# Patient Record
Sex: Male | Born: 1940 | Race: White | Hispanic: No | Marital: Married | Smoking: Never smoker
Health system: Southern US, Community
[De-identification: ages and names within clinical notes are randomized; demographics above are authoritative.]

## PROBLEM LIST (undated history)

## (undated) DIAGNOSIS — E119 Type 2 diabetes mellitus without complications: Secondary | ICD-10-CM

## (undated) DIAGNOSIS — I2510 Atherosclerotic heart disease of native coronary artery without angina pectoris: Secondary | ICD-10-CM

## (undated) DIAGNOSIS — I10 Essential (primary) hypertension: Secondary | ICD-10-CM

## (undated) DIAGNOSIS — E785 Hyperlipidemia, unspecified: Secondary | ICD-10-CM

## (undated) DIAGNOSIS — Z951 Presence of aortocoronary bypass graft: Secondary | ICD-10-CM

## (undated) DIAGNOSIS — I350 Nonrheumatic aortic (valve) stenosis: Secondary | ICD-10-CM

## (undated) DIAGNOSIS — I4891 Unspecified atrial fibrillation: Secondary | ICD-10-CM

## (undated) HISTORY — DX: Atherosclerotic heart disease of native coronary artery without angina pectoris: I25.10

## (undated) HISTORY — DX: Type 2 diabetes mellitus without complications: E11.9

## (undated) HISTORY — PX: CORONARY ARTERY BYPASS GRAFT: SHX141

## (undated) HISTORY — DX: Hyperlipidemia, unspecified: E78.5

## (undated) HISTORY — DX: Presence of aortocoronary bypass graft: Z95.1

## (undated) HISTORY — DX: Nonrheumatic aortic (valve) stenosis: I35.0

## (undated) HISTORY — DX: Unspecified atrial fibrillation: I48.91

## (undated) HISTORY — DX: Essential (primary) hypertension: I10

---

## 2001-08-25 DIAGNOSIS — I2510 Atherosclerotic heart disease of native coronary artery without angina pectoris: Secondary | ICD-10-CM | POA: Diagnosis present

## 2020-08-25 ENCOUNTER — Other Ambulatory Visit: Payer: Self-pay

## 2020-08-25 ENCOUNTER — Emergency Department (HOSPITAL_COMMUNITY): Payer: No Typology Code available for payment source

## 2020-08-25 ENCOUNTER — Encounter (HOSPITAL_COMMUNITY): Payer: Self-pay | Admitting: Family Medicine

## 2020-08-25 ENCOUNTER — Inpatient Hospital Stay (HOSPITAL_COMMUNITY)
Admission: EM | Admit: 2020-08-25 | Discharge: 2020-08-27 | DRG: 100 | Disposition: A | Discharge status: Home Health | Payer: No Typology Code available for payment source | Attending: Internal Medicine | Admitting: Internal Medicine

## 2020-08-25 DIAGNOSIS — Z794 Long term (current) use of insulin: Secondary | ICD-10-CM | POA: Diagnosis not present

## 2020-08-25 DIAGNOSIS — N400 Enlarged prostate without lower urinary tract symptoms: Secondary | ICD-10-CM | POA: Diagnosis present

## 2020-08-25 DIAGNOSIS — E1122 Type 2 diabetes mellitus with diabetic chronic kidney disease: Secondary | ICD-10-CM | POA: Diagnosis present

## 2020-08-25 DIAGNOSIS — F0390 Unspecified dementia without behavioral disturbance: Secondary | ICD-10-CM | POA: Diagnosis present

## 2020-08-25 DIAGNOSIS — X58XXXA Exposure to other specified factors, initial encounter: Secondary | ICD-10-CM | POA: Diagnosis present

## 2020-08-25 DIAGNOSIS — N1832 Chronic kidney disease, stage 3b: Secondary | ICD-10-CM | POA: Diagnosis present

## 2020-08-25 DIAGNOSIS — E860 Dehydration: Secondary | ICD-10-CM

## 2020-08-25 DIAGNOSIS — S00512A Abrasion of oral cavity, initial encounter: Secondary | ICD-10-CM | POA: Diagnosis present

## 2020-08-25 DIAGNOSIS — Z951 Presence of aortocoronary bypass graft: Secondary | ICD-10-CM | POA: Diagnosis not present

## 2020-08-25 DIAGNOSIS — E538 Deficiency of other specified B group vitamins: Secondary | ICD-10-CM | POA: Diagnosis present

## 2020-08-25 DIAGNOSIS — E785 Hyperlipidemia, unspecified: Secondary | ICD-10-CM | POA: Diagnosis present

## 2020-08-25 DIAGNOSIS — G9341 Metabolic encephalopathy: Secondary | ICD-10-CM | POA: Diagnosis present

## 2020-08-25 DIAGNOSIS — R55 Syncope and collapse: Secondary | ICD-10-CM | POA: Diagnosis not present

## 2020-08-25 DIAGNOSIS — Z7982 Long term (current) use of aspirin: Secondary | ICD-10-CM | POA: Diagnosis not present

## 2020-08-25 DIAGNOSIS — Z7984 Long term (current) use of oral hypoglycemic drugs: Secondary | ICD-10-CM

## 2020-08-25 DIAGNOSIS — I2510 Atherosclerotic heart disease of native coronary artery without angina pectoris: Secondary | ICD-10-CM | POA: Diagnosis present

## 2020-08-25 DIAGNOSIS — E1169 Type 2 diabetes mellitus with other specified complication: Secondary | ICD-10-CM | POA: Diagnosis present

## 2020-08-25 DIAGNOSIS — N179 Acute kidney failure, unspecified: Secondary | ICD-10-CM | POA: Diagnosis present

## 2020-08-25 DIAGNOSIS — I350 Nonrheumatic aortic (valve) stenosis: Secondary | ICD-10-CM | POA: Diagnosis present

## 2020-08-25 DIAGNOSIS — I4891 Unspecified atrial fibrillation: Secondary | ICD-10-CM | POA: Diagnosis present

## 2020-08-25 DIAGNOSIS — E119 Type 2 diabetes mellitus without complications: Secondary | ICD-10-CM

## 2020-08-25 DIAGNOSIS — Z20822 Contact with and (suspected) exposure to covid-19: Secondary | ICD-10-CM | POA: Diagnosis present

## 2020-08-25 DIAGNOSIS — R569 Unspecified convulsions: Principal | ICD-10-CM

## 2020-08-25 DIAGNOSIS — D320 Benign neoplasm of cerebral meninges: Secondary | ICD-10-CM | POA: Diagnosis present

## 2020-08-25 DIAGNOSIS — Z79899 Other long term (current) drug therapy: Secondary | ICD-10-CM | POA: Diagnosis not present

## 2020-08-25 DIAGNOSIS — R7989 Other specified abnormal findings of blood chemistry: Secondary | ICD-10-CM | POA: Diagnosis present

## 2020-08-25 DIAGNOSIS — G40209 Localization-related (focal) (partial) symptomatic epilepsy and epileptic syndromes with complex partial seizures, not intractable, without status epilepticus: Secondary | ICD-10-CM

## 2020-08-25 DIAGNOSIS — R778 Other specified abnormalities of plasma proteins: Secondary | ICD-10-CM | POA: Diagnosis present

## 2020-08-25 DIAGNOSIS — I447 Left bundle-branch block, unspecified: Secondary | ICD-10-CM

## 2020-08-25 DIAGNOSIS — I129 Hypertensive chronic kidney disease with stage 1 through stage 4 chronic kidney disease, or unspecified chronic kidney disease: Secondary | ICD-10-CM | POA: Diagnosis present

## 2020-08-25 DIAGNOSIS — I351 Nonrheumatic aortic (valve) insufficiency: Secondary | ICD-10-CM | POA: Diagnosis not present

## 2020-08-25 DIAGNOSIS — I10 Essential (primary) hypertension: Secondary | ICD-10-CM | POA: Diagnosis present

## 2020-08-25 DIAGNOSIS — D72829 Elevated white blood cell count, unspecified: Secondary | ICD-10-CM | POA: Diagnosis present

## 2020-08-25 DIAGNOSIS — I25810 Atherosclerosis of coronary artery bypass graft(s) without angina pectoris: Secondary | ICD-10-CM | POA: Diagnosis not present

## 2020-08-25 DIAGNOSIS — I480 Paroxysmal atrial fibrillation: Secondary | ICD-10-CM | POA: Diagnosis present

## 2020-08-25 DIAGNOSIS — I340 Nonrheumatic mitral (valve) insufficiency: Secondary | ICD-10-CM | POA: Diagnosis not present

## 2020-08-25 HISTORY — DX: Left bundle-branch block, unspecified: I44.7

## 2020-08-25 LAB — CBC WITH DIFFERENTIAL/PLATELET
Abs Immature Granulocytes: 0.11 10*3/uL — ABNORMAL HIGH (ref 0.00–0.07)
Basophils Absolute: 0 10*3/uL (ref 0.0–0.1)
Basophils Relative: 0 %
Eosinophils Absolute: 0.1 10*3/uL (ref 0.0–0.5)
Eosinophils Relative: 0 %
HCT: 40.6 % (ref 39.0–52.0)
Hemoglobin: 13.1 g/dL (ref 13.0–17.0)
Immature Granulocytes: 1 %
Lymphocytes Relative: 4 %
Lymphs Abs: 0.7 10*3/uL (ref 0.7–4.0)
MCH: 29.7 pg (ref 26.0–34.0)
MCHC: 32.3 g/dL (ref 30.0–36.0)
MCV: 92.1 fL (ref 80.0–100.0)
Monocytes Absolute: 0.7 10*3/uL (ref 0.1–1.0)
Monocytes Relative: 4 %
Neutro Abs: 15.4 10*3/uL — ABNORMAL HIGH (ref 1.7–7.7)
Neutrophils Relative %: 91 %
Platelets: 275 10*3/uL (ref 150–400)
RBC: 4.41 MIL/uL (ref 4.22–5.81)
RDW: 13.5 % (ref 11.5–15.5)
WBC: 16.9 10*3/uL — ABNORMAL HIGH (ref 4.0–10.5)
nRBC: 0 % (ref 0.0–0.2)

## 2020-08-25 LAB — RESP PANEL BY RT-PCR (FLU A&B, COVID) ARPGX2
Influenza A by PCR: NEGATIVE
Influenza B by PCR: NEGATIVE
SARS Coronavirus 2 by RT PCR: NEGATIVE

## 2020-08-25 LAB — I-STAT VENOUS BLOOD GAS, ED
Acid-base deficit: 3 mmol/L — ABNORMAL HIGH (ref 0.0–2.0)
Bicarbonate: 20.4 mmol/L (ref 20.0–28.0)
Calcium, Ion: 1.05 mmol/L — ABNORMAL LOW (ref 1.15–1.40)
HCT: 40 % (ref 39.0–52.0)
Hemoglobin: 13.6 g/dL (ref 13.0–17.0)
O2 Saturation: 99 %
Potassium: 3.6 mmol/L (ref 3.5–5.1)
Sodium: 137 mmol/L (ref 135–145)
TCO2: 21 mmol/L — ABNORMAL LOW (ref 22–32)
pCO2, Ven: 31.9 mmHg — ABNORMAL LOW (ref 44.0–60.0)
pH, Ven: 7.413 (ref 7.250–7.430)
pO2, Ven: 127 mmHg — ABNORMAL HIGH (ref 32.0–45.0)

## 2020-08-25 LAB — I-STAT CHEM 8, ED
BUN: 20 mg/dL (ref 8–23)
Calcium, Ion: 1.02 mmol/L — ABNORMAL LOW (ref 1.15–1.40)
Chloride: 105 mmol/L (ref 98–111)
Creatinine, Ser: 1.6 mg/dL — ABNORMAL HIGH (ref 0.61–1.24)
Glucose, Bld: 339 mg/dL — ABNORMAL HIGH (ref 70–99)
HCT: 42 % (ref 39.0–52.0)
Hemoglobin: 14.3 g/dL (ref 13.0–17.0)
Potassium: 3.6 mmol/L (ref 3.5–5.1)
Sodium: 136 mmol/L (ref 135–145)
TCO2: 20 mmol/L — ABNORMAL LOW (ref 22–32)

## 2020-08-25 LAB — URINALYSIS, ROUTINE W REFLEX MICROSCOPIC
Bacteria, UA: NONE SEEN
Bilirubin Urine: NEGATIVE
Glucose, UA: >=500 mg/dL — AB
Ketones, ur: 20 mg/dL — AB
Leukocytes,Ua: NEGATIVE
Nitrite: NEGATIVE
Protein, ur: 100 mg/dL — AB
Specific Gravity, Urine: 1.018 (ref 1.005–1.030)
pH: 5 (ref 5.0–8.0)

## 2020-08-25 LAB — TSH: TSH: 1.68 u[IU]/mL (ref 0.350–4.500)

## 2020-08-25 LAB — COMPREHENSIVE METABOLIC PANEL
ALT: 20 U/L (ref 0–44)
AST: 30 U/L (ref 15–41)
Albumin: 3.9 g/dL (ref 3.5–5.0)
Alkaline Phosphatase: 70 U/L (ref 38–126)
Anion gap: 13 (ref 5–15)
BUN: 19 mg/dL (ref 8–23)
CO2: 20 mmol/L — ABNORMAL LOW (ref 22–32)
Calcium: 9.4 mg/dL (ref 8.9–10.3)
Chloride: 102 mmol/L (ref 98–111)
Creatinine, Ser: 1.7 mg/dL — ABNORMAL HIGH (ref 0.61–1.24)
GFR, Estimated: 40 mL/min — ABNORMAL LOW (ref >60)
Glucose, Bld: 334 mg/dL — ABNORMAL HIGH (ref 70–99)
Potassium: 3.5 mmol/L (ref 3.5–5.1)
Sodium: 135 mmol/L (ref 135–145)
Total Bilirubin: 1 mg/dL (ref 0.3–1.2)
Total Protein: 7.3 g/dL (ref 6.5–8.1)

## 2020-08-25 LAB — AMMONIA: Ammonia: <9 umol/L — ABNORMAL LOW (ref 9–35)

## 2020-08-25 LAB — RAPID URINE DRUG SCREEN, HOSP PERFORMED
Amphetamines: NOT DETECTED
Barbiturates: NOT DETECTED
Benzodiazepines: POSITIVE — AB
Cocaine: NOT DETECTED
Opiates: NOT DETECTED
Tetrahydrocannabinol: NOT DETECTED

## 2020-08-25 LAB — VITAMIN B12: Vitamin B-12: 79 pg/mL — ABNORMAL LOW (ref 180–914)

## 2020-08-25 LAB — TROPONIN I (HIGH SENSITIVITY)
Troponin I (High Sensitivity): 48 ng/L — ABNORMAL HIGH (ref <18)
Troponin I (High Sensitivity): 140 ng/L (ref <18)

## 2020-08-25 LAB — MAGNESIUM: Magnesium: 1.6 mg/dL — ABNORMAL LOW (ref 1.7–2.4)

## 2020-08-25 LAB — CBG MONITORING, ED: Glucose-Capillary: 210 mg/dL — ABNORMAL HIGH (ref 70–99)

## 2020-08-25 LAB — LACTIC ACID, PLASMA
Lactic Acid, Venous: 3.6 mmol/L (ref 0.5–1.9)
Lactic Acid, Venous: 1.9 mmol/L (ref 0.5–1.9)

## 2020-08-25 LAB — CK: Total CK: 187 U/L (ref 49–397)

## 2020-08-25 LAB — BRAIN NATRIURETIC PEPTIDE: B Natriuretic Peptide: 471.3 pg/mL — ABNORMAL HIGH (ref 0.0–100.0)

## 2020-08-25 MED ORDER — METOPROLOL TARTRATE 25 MG PO TABS
25.0000 mg | ORAL_TABLET | Freq: Two times a day (BID) | ORAL | Status: DC
  Filled 2020-08-25: qty 1

## 2020-08-25 MED ORDER — MAGNESIUM SULFATE 2 GM/50ML IV SOLN
2.0000 g | Freq: Once | INTRAVENOUS | Status: AC
  Administered 2020-08-25: 2 g via INTRAVENOUS
  Filled 2020-08-25: qty 50

## 2020-08-25 MED ORDER — LACTATED RINGERS IV SOLN: INTRAVENOUS | Status: AC

## 2020-08-25 MED ORDER — SODIUM CHLORIDE 0.9 % IV BOLUS
1000.0000 mL | Freq: Once | INTRAVENOUS | Status: AC
  Administered 2020-08-25: 1000 mL via INTRAVENOUS

## 2020-08-25 MED ORDER — INSULIN ASPART 100 UNIT/ML IJ SOLN
0.0000 [IU] | Freq: Three times a day (TID) | INTRAMUSCULAR | Status: DC
  Administered 2020-08-26: 1 [IU] via SUBCUTANEOUS
  Administered 2020-08-26 – 2020-08-27 (×3): 2 [IU] via SUBCUTANEOUS

## 2020-08-25 MED ORDER — LORAZEPAM 2 MG/ML IJ SOLN
1.0000 mg | Freq: Once | INTRAMUSCULAR | Status: AC
  Administered 2020-08-25: 1 mg via INTRAVENOUS
  Filled 2020-08-25: qty 1

## 2020-08-25 MED ORDER — MIDAZOLAM HCL 2 MG/2ML IJ SOLN
INTRAMUSCULAR | Status: AC
  Administered 2020-08-25: 2 mg
  Filled 2020-08-25: qty 2

## 2020-08-25 MED ORDER — LEVETIRACETAM IN NACL 1000 MG/100ML IV SOLN
1000.0000 mg | Freq: Once | INTRAVENOUS | Status: AC
  Administered 2020-08-25: 1000 mg via INTRAVENOUS
  Filled 2020-08-25: qty 100

## 2020-08-25 MED ORDER — INSULIN DETEMIR 100 UNIT/ML ~~LOC~~ SOLN
9.0000 [IU] | Freq: Every day | SUBCUTANEOUS | Status: DC
  Administered 2020-08-25 – 2020-08-26 (×2): 9 [IU] via SUBCUTANEOUS
  Filled 2020-08-25 (×4): qty 0.09

## 2020-08-25 MED ORDER — ENOXAPARIN SODIUM 40 MG/0.4ML IJ SOSY
40.0000 mg | PREFILLED_SYRINGE | INTRAMUSCULAR | Status: DC
  Administered 2020-08-25 – 2020-08-26 (×2): 40 mg via SUBCUTANEOUS
  Filled 2020-08-25 (×2): qty 0.4

## 2020-08-25 NOTE — ED Provider Notes (Signed)
Patient signed out to me at 3 PM.  Here after suspected seizure.  This was unwitnessed.  Patient lives at home with wife who has dementia and a stepson.  Supposedly family member found the patient seemingly having seizure-like activity on the ground.  No history of seizures.  EMS thought possibly V. tach.  Patient was combative with EMS.  Was given 5 of Versed.  Twelve-lead after Versed showed atrial fibrillation with RVR.  EKG upon arrival here shows rate controlled atrial fibrillation with RVR.  Patient is alert and oriented and able to answer simple questions but does not remember today's events very well.  He states he feels very dry in his mouth.  He is not having any pain.  He does have any recollection of today's event.  He has a history of diabetes, hypertension.  No history of A. fib.  He is not on blood thinners.  Neurologically he appears intact on exam.  He appears very dry with dry mucous membranes.  He denies any abdominal pain, nausea, vomiting.  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  CT scan of the neck unremarkable for injury.  COVID and flu test are negative.  Urinalysis negative for infection.  Lactic acid mildly elevated at 3.6 but vital signs are normal with no fever.  Suspect this could be from seizure-like episode are from dehydration.  White count is 16.9 and elevated creatinine at 1.7 as well as elevated blood sugar at 334.  Blood gases unremarkable.  Neurology consulted and recommended an MRI and EEG.  Patient was loaded with Keppra.  No concern for infectious process or meningitis at this time.  Can hold off on LP per neurology as well as patient has no signs to suggest meningitis, not having a headache.  Opponent is mildly elevated to 48.  Magnesium is 1.6.  Overall suspect that this could have been seizure activity or could also have been syncope.  Has new A. fib.  Not having any chest pain.  No ischemic changes on EKG.  Could be metabolic driving  today's issues as well given AKI.  Will admit to medicine for further seizure/syncope work-up.  This chart was dictated using voice recognition software.  Despite best efforts to proofread,  errors can occur which can change the documentation meaning.    Lennice Sites, DO 08/25/20 1608

## 2020-08-25 NOTE — ED Notes (Signed)
RN transported pt to CT 

## 2020-08-25 NOTE — ED Triage Notes (Addendum)
Pt arrives via [facility] from home after family found him convulsing on the ground. No hx of seizures, family poor historians. Per EMS pt was on ground, found to be in [place], pt became combative w/ EMS, got a total of 5mg  versed IV. CBG 236, 12 lead post-versed shows atrial fibrillation. Pt alert to self only. 18g LAC

## 2020-08-25 NOTE — Consult Note (Signed)
Cardiology Consultation:   Patient ID: [name]  MRN: [id number]; DOB: Jun 02, 1940  Admit date: 08/25/2020 Date of Consult: 08/25/2020  PCP:  Merryl Hacker, No   [facility] Providers Cardiologist:  None        Patient Profile:   Erik Hines is a 80 y.o. male with a hx of CAD with CABG x5 in 2003, HTN, BPH, HLD, DM2 who is being seen 08/25/2020 for the evaluation of afib RVR and possible VT(?)-> but its actually Afib RVR with underlying LBBB at the request of Dr Ebony Hail.  History of Present Illness:   Erik Hines is a 80 y.o. male with medical history significant of CAD with CABG x5 in 2003, HTN, BPH, HLD, DM2 who presented to ED with witnessed seizure like activity at home.  Patient is altered and cannot provide history. Son at bedside and other history is from chart review Per HPI: the son came back from the church and saw that the patient was lying down on the floor and shaking with possile bowel/bladder incontinence and tongue bite. This occurred for 7 mns and by the time EMS came- seizures stopped and he was confused. They gave him sedating meds and brought him here.  Per daugther's history obtained on arrival by the provider :He just recently moved from [place] to [place]. He has been living in [place] with is wife who has dementia, which was recently diagnosed, but has had had some memory issues for a while. She was in charge of his medication. A week ago he fell at his house and had a cut on his chin, but his daughter doesn't know how bad the fall was. He was fine all week. She got a phone call from her step brother around 1:30 this afternoon saying he was having a seizure on the floor. He had gone to church and came home. LKW around 1:00pm. Only symptom he reported was fatigue prior to the possible seizure.  ER: EMS strip was available- underlying rhtyhm is afib, LBBB and noted to be tachycardic, HR in 150s. He converted back to NSR in the ER.  Significant labs: 20  ketones in urine, >500 glucose, WBC: 16.9, creatinine: 1.70, troponin: 48, magnesium: 1.6, lactic acid: 3.6 CTH: meningioma. No acute findings. CXR; cardiomegaly. Given 2L bolus of fluids, magnesium repletion and 1mg  ativan and loaded with keppra. Neurology was consulted and asked for eeg and MRI.  Past Medical History:  Diagnosis Date   LBBB (left bundle branch block) 08/25/2020       Home Medications:  Prior to Admission medications   Medication Sig Start Date End Date Taking? Authorizing Provider  aspirin 81 MG EC tablet Take 81 mg by mouth daily. 02/19/15  Yes [provider]  atorvastatin (LIPITOR) 40 MG tablet Take 40 mg by mouth daily. 03/14/20 03/15/21 Yes [provider]  finasteride (PROSCAR) 5 MG tablet Take 5 mg by mouth daily. 03/14/20 03/15/21 Yes [provider]  insulin detemir (LEVEMIR) 100 UNIT/ML injection Inject 18 Units into the skin 2 (two) times daily. 12/26/19 12/26/20 Yes [provider]  lisinopril-hydrochlorothiazide (ZESTORETIC) 10-12.5 MG tablet Take 1 tablet by mouth daily.   Yes [provider]  metFORMIN (GLUCOPHAGE) 1000 MG tablet Take 1,000 mg by mouth 2 (two) times daily. 03/14/20 03/15/21 Yes [provider]  tamsulosin (FLOMAX) 0.4 MG CAPS capsule Take 1 capsule by mouth daily. 01/26/20 01/26/21 Yes [provider]    Inpatient Medications: Scheduled Meds:  enoxaparin (LOVENOX) injection  40 mg Subcutaneous Q24H   [  START ON 08/26/2020] insulin aspart  0-9 Units Subcutaneous TID WC   insulin detemir  9 Units Subcutaneous QHS   metoprolol tartrate  25 mg Oral BID   Continuous Infusions:  lactated ringers 75 mL/hr at 08/25/20 1936   PRN Meds:   Allergies:   No Known Allergies  Social History:   Social History   Socioeconomic History   Marital status: Married    Spouse name: Not on file   Number of children: Not on file   Years of education: Not on file   Highest education level: Not on  file  Occupational History   Not on file  Tobacco Use   Smoking status: Not on file   Smokeless tobacco: Not on file  Substance and Sexual Activity   Alcohol use: Not on file   Drug use: Not on file   Sexual activity: Not on file  Other Topics Concern   Not on file  Social History Narrative   Not on file   Social Determinants of Health   Financial Resource Strain: Not on file  Food Insecurity: Not on file  Transportation Needs: Not on file  Physical Activity: Not on file  Stress: Not on file  Social Connections: Not on file  Intimate Partner Violence: Not on file    Family History:   No family history on file.   ROS:  Please see the history of present illness.   All other ROS reviewed and negative.     Physical Exam/Data:   Vitals:   08/25/20 2030 08/25/20 2045 08/25/20 2130 08/25/20 2159  BP: (!) 145/80 (!) 151/63 (!) 116/102 (!) 116/102  Pulse: 88 77 60 (!) 55  Resp: 18  17   Temp:      TempSrc:      SpO2: 100% 96% 100%     Intake/Output Summary (Last 24 hours) at 08/25/2020 2221 Last data filed at 08/25/2020 1731 Gross per 24 hour  Intake 2150 ml  Output --  Net 2150 ml   No flowsheet data found.   There is no height or weight on file to calculate BMI.  General:  Well nourished, well developed, confused, in soft restraints HEENT: normal, tongue bite Lymph: no adenopathy Neck: no JVD Endocrine:  No thryomegaly Vascular: No carotid bruits; FA pulses 2+ bilaterally without bruits  Cardiac:  normal S1, ejection systolic murmur heard RUSB, non radiating, unclear about S2 (soft or not) Lungs:  clear to auscultation bilaterally, no wheezing, rhonchi or rales  Abd: soft, nontender, no hepatomegaly  Ext: no edema Musculoskeletal:  No deformities, BUE and BLE strength normal and equal Skin: warm and dry  Neuro:  confused, in restarints   Laboratory Data:  High Sensitivity Troponin:   Recent Labs  Lab 08/25/20 1445 08/25/20 1655  TROPONINIHS 48* 140*      Chemistry Recent Labs  Lab 08/25/20 1445 08/25/20 1511  NA 135 137  136  K 3.5 3.6  3.6  CL 102 105  CO2 20*  --   GLUCOSE 334* 339*  BUN 19 20  CREATININE 1.70* 1.60*  CALCIUM 9.4  --   GFRNONAA 40*  --   ANIONGAP 13  --     Recent Labs  Lab 08/25/20 1445  PROT 7.3  ALBUMIN 3.9  AST 30  ALT 20  ALKPHOS 70  BILITOT 1.0   Hematology Recent Labs  Lab 08/25/20 1445 08/25/20 1511  WBC 16.9*  --   RBC 4.41  --   HGB 13.1 13.6  14.3  HCT 40.6 40.0  42.0  MCV 92.1  --   MCH 29.7  --   MCHC 32.3  --   RDW 13.5  --   PLT 275  --    BNP Recent Labs  Lab 08/25/20 1840  BNP 471.3*    DDimer No results for input(s): DDIMER in the last 168 hours.   Radiology/Studies:  CT Head Wo Contrast  Result Date: 08/25/2020 CLINICAL DATA:  80 year old male with altered mental status, headache and possible seizure. EXAM: CT HEAD WITHOUT CONTRAST CT CERVICAL SPINE WITHOUT CONTRAST TECHNIQUE: Multidetector CT imaging of the head and cervical spine was performed following the standard protocol without intravenous contrast. Multiplanar CT image reconstructions of the cervical spine were also generated. COMPARISON:  None. FINDINGS: CT HEAD FINDINGS Brain: A 1.8 x 1.2 x 2 cm partially calcified extra-axial posterior RIGHT posterior parietal mass is noted compatible with a meningioma. No adjacent parenchymal edema is noted. Atrophy and mild chronic small-vessel white matter ischemic changes are noted. There is no evidence of acute infarct, hemorrhage, midline shift or hydrocephalus. Vascular: Carotid and basilar atherosclerotic calcifications are noted. Skull: No acute or suspicious abnormalities. Sinuses/Orbits: Opacified RIGHT maxillary sinus noted. Other: None CT CERVICAL SPINE FINDINGS Alignment: Normal. Skull base and vertebrae: No acute fracture. No primary bone lesion or focal pathologic process. Soft tissues and spinal canal: No prevertebral fluid or swelling. No visible canal  hematoma. Disc levels: Moderate degenerative disc disease/spondylosis at C5-6 and C6-7 noted. Facet arthropathy within the cervical spine also identified. Upper chest: No acute abnormality Other: None IMPRESSION: 1. No evidence of acute intracranial abnormality. Atrophy and chronic small-vessel white matter ischemic changes. 2. 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 No adjacent parenchymal edema. 3. No static evidence of acute injury to the cervical spine. Electronically Signed   By: Margarette Canada M.D.   On: 08/25/2020 15:17   CT Cervical Spine Wo Contrast  Result Date: 08/25/2020 CLINICAL DATA:  80 year old male with altered mental status, headache and possible seizure. EXAM: CT HEAD WITHOUT CONTRAST CT CERVICAL SPINE WITHOUT CONTRAST TECHNIQUE: Multidetector CT imaging of the head and cervical spine was performed following the standard protocol without intravenous contrast. Multiplanar CT image reconstructions of the cervical spine were also generated. COMPARISON:  None. FINDINGS: CT HEAD FINDINGS Brain: A 1.8 x 1.2 x 2 cm partially calcified extra-axial posterior RIGHT posterior parietal mass is noted compatible with a meningioma. No adjacent parenchymal edema is noted. Atrophy and mild chronic small-vessel white matter ischemic changes are noted. There is no evidence of acute infarct, hemorrhage, midline shift or hydrocephalus. Vascular: Carotid and basilar atherosclerotic calcifications are noted. Skull: No acute or suspicious abnormalities. Sinuses/Orbits: Opacified RIGHT maxillary sinus noted. Other: None CT CERVICAL SPINE FINDINGS Alignment: Normal. Skull base and vertebrae: No acute fracture. No primary bone lesion or focal pathologic process. Soft tissues and spinal canal: No prevertebral fluid or swelling. No visible canal hematoma. Disc levels: Moderate degenerative disc disease/spondylosis at C5-6 and C6-7 noted. Facet arthropathy within the cervical spine also identified. Upper chest:  No acute abnormality Other: None IMPRESSION: 1. No evidence of acute intracranial abnormality. Atrophy and chronic small-vessel white matter ischemic changes. 2. 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 No adjacent parenchymal edema. 3. No static evidence of acute injury to the cervical spine. Electronically Signed   By: Margarette Canada M.D.   On: 08/25/2020 15:17   DG Chest Portable 1 View  Result Date: 08/25/2020 CLINICAL  DATA:  Altered mental status EXAM: PORTABLE CHEST 1 VIEW COMPARISON:  None. FINDINGS: Cardiomegaly and CABG changes noted. This is a low volume study. There is no evidence of focal airspace disease, pulmonary edema, suspicious pulmonary nodule/mass, pleural effusion, or pneumothorax. No acute bony abnormalities are identified. IMPRESSION: Cardiomegaly without evidence of acute cardiopulmonary disease. Electronically Signed   By: Margarette Canada M.D.   On: 08/25/2020 16:40    EKG: LBBB, initialy EKG shows Afib Successive EKG shows NSR, LBBB   Assessment and Plan:   Afib RVR- unclear chronicity->converted to NSR LBBB Trop 48->140 Seizures and fall H/o CAD s/p CABG x5( 2003) in [place]- no records available Systolic murmur (Suspect AS) HTN, HLD, DM-2 Altered mental status/acute metabolic encephalopathy H/o dementia   Plan: - patient is currently converted to NSR. The EMS strip which shows wide complex tachycardia- is afib RVR (irregular rhythm) and underlying LBBB. - obtain ECHO in am  - trend trops, ekg. Suspect trop elevation is due to demand ischemia - f/w Neurology recs and seizure rx and prophylaxis - continue home meds: continue aspirin 81mg , statin therapy, continue lisinopril. Discontinue HCTZ.  check Lipid profile, a1c. Start metoprolol 25mg  BID.  - in regards to Afib- elderly gentleman with falls, seizures, dementia increased bleeding risk. Risk benefit should be d/w the patient (when Aox3) and family (son). Chadsvasc (atleast 4), has bled is probably  equally high. Would favor conservative mx  - if echo comes back ok (without severe AS) then we will probably sign off   Risk Assessment/Risk Scores:          CHA2DS2-VASc Score = 4  This indicates a 4.8% annual risk of stroke. The patient's score is based upon: CHF History: No HTN History: Yes Diabetes History: Yes Stroke History: No Vascular Disease History: No Age Score: 2 Gender Score: 0        For questions or updates, please contact [place] Please consult [email or web address] for contact info under    Signed, Renae Fickle, MD  08/25/2020 10:21 PM

## 2020-08-25 NOTE — ED Provider Notes (Signed)
[facility] EMERGENCY DEPARTMENT Provider Note   CSN: [id number] Arrival date & time: 08/25/20  1410     History Chief Complaint  Patient presents with   Altered Mental Status   Seizures    Erik Hines is a 80 y.o. male.  HPI     80 year old male with history of coronary artery disease, diabetes, glaucoma, BPH, hypertension presents with concern for seizure-like activity.   History is limited by patient's altered mental status.   Per family, he had fatigue yesterday, today, he had a "sinus headache" that was fairly mild. No known fevers, no numbness/weakness/nausea/vomiting/difficulty speaking.  Son reports he reported the headache and stayed home from church this morning, he was acting normally in the house but then heard from his wife that he was down and found him having convulsions and face down on the ground. Reports he was seizing for approximately 5-7 minutes. No history of seizure.    For EMS, he was combative, they were initially concerned for possible VT and gave him 2.5mg x2, then noted atrial fibrillation with LBBB.  Not on anticoagulation.         No family history on file.     Home Medications Prior to Admission medications   Medication Sig Start Date End Date Taking? Authorizing Provider  aspirin 81 MG EC tablet Take 81 mg by mouth daily. 02/19/15  Yes [provider]  atorvastatin (LIPITOR) 40 MG tablet Take 40 mg by mouth daily. 03/14/20 03/15/21 Yes [provider]  finasteride (PROSCAR) 5 MG tablet Take 5 mg by mouth daily. 03/14/20 03/15/21 Yes [provider]  insulin detemir (LEVEMIR) 100 UNIT/ML injection Inject 18 Units into the skin 2 (two) times daily. 12/26/19 12/26/20 Yes [provider]  lisinopril-hydrochlorothiazide (ZESTORETIC) 10-12.5 MG tablet Take 1 tablet by mouth daily.   Yes [provider]  metFORMIN (GLUCOPHAGE) 1000 MG tablet Take 1,000 mg by mouth 2 (two) times daily.  03/14/20 03/15/21 Yes [provider]  tamsulosin (FLOMAX) 0.4 MG CAPS capsule Take 1 capsule by mouth daily. 01/26/20 01/26/21 Yes [provider]    Allergies    Patient has no known allergies.  Review of Systems   Review of Systems  Constitutional:  Positive for fatigue. Negative for fever.  HENT:  Negative for sore throat.   Eyes:  Negative for visual disturbance.  Respiratory:  Negative for shortness of breath.   Cardiovascular:  Negative for chest pain.  Gastrointestinal:  Negative for abdominal pain, nausea and vomiting.  Genitourinary:  Negative for difficulty urinating.  Musculoskeletal:  Negative for back pain and neck stiffness.  Skin:  Negative for rash.  Neurological:  Positive for seizures and headaches. Negative for syncope.   Physical Exam Updated Vital Signs BP (!) 116/102   Pulse (!) 55   Temp 98.3 F (36.8 C) (Rectal)   Resp 17   SpO2 100%   Physical Exam Vitals and nursing note reviewed.  Constitutional:      General: He is not in acute distress.    Appearance: He is well-developed. He is not diaphoretic.  HENT:     Head: Normocephalic and atraumatic.     Mouth/Throat:     Pharynx: Oropharynx is clear. No oropharyngeal exudate or posterior oropharyngeal erythema.     Comments: Abrasion left side of tongue  Eyes:     Conjunctiva/sclera: Conjunctivae normal.     Pupils: Pupils are equal, round, and reactive to light.  Cardiovascular:     Rate and Rhythm:  Tachycardia present. Rhythm irregular.     Heart sounds: Normal heart sounds. No murmur heard.   No friction rub. No gallop.  Pulmonary:     Effort: Pulmonary effort is normal. No respiratory distress.     Breath sounds: Normal breath sounds. No wheezing or rales.  Abdominal:     General: There is no distension.     Palpations: Abdomen is soft.     Tenderness: There is no abdominal tenderness. There is no guarding.  Musculoskeletal:     Cervical back: Normal range of motion.   Skin:    General: Skin is warm and dry.  Neurological:     Mental Status: He is alert.     Comments: Sleepy (after versed), nystagmus, difficulty tracking with EOM but exam limited by versed . Normal strength sensation, symmetric facies    ED Results / Procedures / Treatments   Labs (all labs ordered are listed, but only abnormal results are displayed) Labs Reviewed  CBC WITH DIFFERENTIAL/PLATELET - Abnormal; Notable for the following components:      Result Value   WBC 16.9 (*)    Neutro Abs 15.4 (*)    Abs Immature Granulocytes 0.11 (*)    All other components within normal limits  COMPREHENSIVE METABOLIC PANEL - Abnormal; Notable for the following components:   CO2 20 (*)    Glucose, Bld 334 (*)    Creatinine, Ser 1.70 (*)    GFR, Estimated 40 (*)    All other components within normal limits  MAGNESIUM - Abnormal; Notable for the following components:   Magnesium 1.6 (*)    All other components within normal limits  LACTIC ACID, PLASMA - Abnormal; Notable for the following components:   Lactic Acid, Venous 3.6 (*)    All other components within normal limits  URINALYSIS, ROUTINE W REFLEX MICROSCOPIC - Abnormal; Notable for the following components:   Glucose, UA >=500 (*)    Hgb urine dipstick SMALL (*)    Ketones, ur 20 (*)    Protein, ur 100 (*)    All other components within normal limits  RAPID URINE DRUG SCREEN, HOSP PERFORMED - Abnormal; Notable for the following components:   Benzodiazepines POSITIVE (*)    All other components within normal limits  BRAIN NATRIURETIC PEPTIDE - Abnormal; Notable for the following components:   B Natriuretic Peptide 471.3 (*)    All other components within normal limits  VITAMIN B12 - Abnormal; Notable for the following components:   Vitamin B-12 79 (*)    All other components within normal limits  AMMONIA - Abnormal; Notable for the following components:   Ammonia <9 (*)    All other components within normal limits  I-STAT  CHEM 8, ED - Abnormal; Notable for the following components:   Creatinine, Ser 1.60 (*)    Glucose, Bld 339 (*)    Calcium, Ion 1.02 (*)    TCO2 20 (*)    All other components within normal limits  I-STAT VENOUS BLOOD GAS, ED - Abnormal; Notable for the following components:   pCO2, Ven 31.9 (*)    pO2, Ven 127.0 (*)    TCO2 21 (*)    Acid-base deficit 3.0 (*)    Calcium, Ion 1.05 (*)    All other components within normal limits  CBG MONITORING, ED - Abnormal; Notable for the following components:   Glucose-Capillary 210 (*)    All other components within normal limits  TROPONIN I (HIGH SENSITIVITY) - Abnormal; Notable for  the following components:   Troponin I (High Sensitivity) 48 (*)    All other components within normal limits  TROPONIN I (HIGH SENSITIVITY) - Abnormal; Notable for the following components:   Troponin I (High Sensitivity) 140 (*)    All other components within normal limits  RESP PANEL BY RT-PCR (FLU A&B, COVID) [id number]  URINE CULTURE  CULTURE, BLOOD (ROUTINE X 2)  CULTURE, BLOOD (ROUTINE X 2)  LACTIC ACID, PLASMA  CK  TSH  ETHANOL  HEMOGLOBIN A1C  COMPREHENSIVE METABOLIC PANEL  CBC  MAGNESIUM    EKG EKG Interpretation  Date/Time:  Sunday August 25 2020 14:43:25 EDT Ventricular Rate:  100 PR Interval:  238 QRS Duration: 134 QT Interval:  363 QTC Calculation: 469 R Axis:   14 Text Interpretation: Atrial fibrillation Ventricular premature complex Prolonged PR interval Left bundle branch block Confirmed by Gareth Morgan [phone]) on 08/25/2020 2:53:16 PM  Radiology CT Head Wo Contrast  Result Date: 08/25/2020 CLINICAL DATA:  80 year old male with altered mental status, headache and possible seizure. EXAM: CT HEAD WITHOUT CONTRAST CT CERVICAL SPINE WITHOUT CONTRAST TECHNIQUE: Multidetector CT imaging of the head and cervical spine was performed following the standard protocol without intravenous contrast. Multiplanar CT image reconstructions of the  cervical spine were also generated. COMPARISON:  None. FINDINGS: CT HEAD FINDINGS Brain: A 1.8 x 1.2 x 2 cm partially calcified extra-axial posterior RIGHT posterior parietal mass is noted compatible with a meningioma. No adjacent parenchymal edema is noted. Atrophy and mild chronic small-vessel white matter ischemic changes are noted. There is no evidence of acute infarct, hemorrhage, midline shift or hydrocephalus. Vascular: Carotid and basilar atherosclerotic calcifications are noted. Skull: No acute or suspicious abnormalities. Sinuses/Orbits: Opacified RIGHT maxillary sinus noted. Other: None CT CERVICAL SPINE FINDINGS Alignment: Normal. Skull base and vertebrae: No acute fracture. No primary bone lesion or focal pathologic process. Soft tissues and spinal canal: No prevertebral fluid or swelling. No visible canal hematoma. Disc levels: Moderate degenerative disc disease/spondylosis at C5-6 and C6-7 noted. Facet arthropathy within the cervical spine also identified. Upper chest: No acute abnormality Other: None IMPRESSION: 1. No evidence of acute intracranial abnormality. Atrophy and chronic small-vessel white matter ischemic changes. 2. 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 No adjacent parenchymal edema. 3. No static evidence of acute injury to the cervical spine. Electronically Signed   By: Margarette Canada M.D.   On: 08/25/2020 15:17   CT Cervical Spine Wo Contrast  Result Date: 08/25/2020 CLINICAL DATA:  80 year old male with altered mental status, headache and possible seizure. EXAM: CT HEAD WITHOUT CONTRAST CT CERVICAL SPINE WITHOUT CONTRAST TECHNIQUE: Multidetector CT imaging of the head and cervical spine was performed following the standard protocol without intravenous contrast. Multiplanar CT image reconstructions of the cervical spine were also generated. COMPARISON:  None. FINDINGS: CT HEAD FINDINGS Brain: A 1.8 x 1.2 x 2 cm partially calcified extra-axial posterior RIGHT posterior  parietal mass is noted compatible with a meningioma. No adjacent parenchymal edema is noted. Atrophy and mild chronic small-vessel white matter ischemic changes are noted. There is no evidence of acute infarct, hemorrhage, midline shift or hydrocephalus. Vascular: Carotid and basilar atherosclerotic calcifications are noted. Skull: No acute or suspicious abnormalities. Sinuses/Orbits: Opacified RIGHT maxillary sinus noted. Other: None CT CERVICAL SPINE FINDINGS Alignment: Normal. Skull base and vertebrae: No acute fracture. No primary bone lesion or focal pathologic process. Soft tissues and spinal canal: No prevertebral fluid or swelling. No visible canal hematoma. Disc levels: Moderate degenerative  disc disease/spondylosis at C5-6 and C6-7 noted. Facet arthropathy within the cervical spine also identified. Upper chest: No acute abnormality Other: None IMPRESSION: 1. No evidence of acute intracranial abnormality. Atrophy and chronic small-vessel white matter ischemic changes. 2. 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 No adjacent parenchymal edema. 3. No static evidence of acute injury to the cervical spine. Electronically Signed   By: Margarette Canada M.D.   On: 08/25/2020 15:17   DG Chest Portable 1 View  Result Date: 08/25/2020 CLINICAL DATA:  Altered mental status EXAM: PORTABLE CHEST 1 VIEW COMPARISON:  None. FINDINGS: Cardiomegaly and CABG changes noted. This is a low volume study. There is no evidence of focal airspace disease, pulmonary edema, suspicious pulmonary nodule/mass, pleural effusion, or pneumothorax. No acute bony abnormalities are identified. IMPRESSION: Cardiomegaly without evidence of acute cardiopulmonary disease. Electronically Signed   By: Margarette Canada M.D.   On: 08/25/2020 16:40    Procedures Procedures   Medications Ordered in ED Medications  metoprolol tartrate (LOPRESSOR) tablet 25 mg (25 mg Oral Not Given 08/25/20 2159)  lactated ringers infusion ( Intravenous New  Bag/Given 08/25/20 1936)  enoxaparin (LOVENOX) injection 40 mg (40 mg Subcutaneous Given 08/25/20 1933)  insulin detemir (LEVEMIR) injection 9 Units (9 Units Subcutaneous Given 08/25/20 2219)  insulin aspart (novoLOG) injection 0-9 Units (has no administration in time range)  midazolam (VERSED) 2 MG/2ML injection (2 mg  Given 08/25/20 1434)  levETIRAcetam (KEPPRA) IVPB 1000 mg/100 mL premix (0 mg Intravenous Stopped 08/25/20 1644)  LORazepam (ATIVAN) injection 1 mg (1 mg Intravenous Given 08/25/20 1604)  sodium chloride 0.9 % bolus 1,000 mL (0 mLs Intravenous Stopped 08/25/20 1730)  sodium chloride 0.9 % bolus 1,000 mL (0 mLs Intravenous Stopped 08/25/20 1731)  magnesium sulfate IVPB 2 g 50 mL (0 g Intravenous Stopped 08/25/20 1731)    ED Course  I have reviewed the triage vital signs and the nursing notes.  Pertinent labs & imaging results that were available during my care of the patient were reviewed by me and considered in my medical decision making (see chart for details).    MDM Rules/Calculators/A&P                           80 year old male with history of coronary artery disease, diabetes, glaucoma, BPH, hypertension presents with concern for seizure-like activity.  EKG with atrial fibrillation with LBBB, new from prior.  Emergent CT obtained and CT CSpine given seizure, fall, altered mental status without acute bleed or fracture.   Istat labs show hyperglycemia in 300s, no sign of DKA or significant electrolyte abnormalities.    Afebrile, no nuchal rigidity, do not feel he requires lumbar puncture at this time. Given keppra.  Leukocytosis may be secondary to stress from seizure, UA< blood cx ordered and pending.    Consulted Neurology, ordered MR WWO contrast, EEG.  Awaiting CMP, troponin, plan to admit to hospital.   Final Clinical Impression(s) / ED Diagnoses Final diagnoses:  Seizure-like activity ([place])  Dehydration  AKI (acute kidney injury) ([place])  Elevated troponin    Rx /  DC Orders ED Discharge Orders     None        Gareth Morgan, MD 08/25/20 2318

## 2020-08-25 NOTE — H&P (Signed)
History and Physical    Erik Hines WER:[id number] DOB: May 20, 1940 DOA: 08/25/2020  PCP: Pcp, No Consultants:  cardiology in [place] Patient coming from:  Home - lives with wife   Chief Complaint: witnessed seizure like event.   HPI: Erik Hines is a 80 y.o. male with medical history significant of CAD with CABG x5 in 2003, HTN, BPH, HLD, DM2 who presented to ED with witnessed seizure like activity at home. History provided by daughter, Erik Hines.  He just recently moved from [place] to [place]. He has been living in [place] with is wife who has dementia, which was recently diagnosed, but has had had some memory issues for a while. She was in charge of his medication. A week ago he fell at his house and had a cut on his chin, but his daughter doesn't know how bad the fall was. He was fine all week. She got a phone call from her step brother around 1:30 this afternoon saying he was having a seizure on the floor. He had gone to church and came home. LKW around 1:00pm. Only symptom he reported was fatigue prior to the possible seizure. On exam he can follow directions, but will not open eyes.   Daughter states he has not been ill recently. No complaints of chest pain, shortness of breath, stomach pain, N/V/D. No fever/chills. No coughing or feeling unwell.   Son in room when I went back in and states he witnessed his dad shaking all over for a full 7 minutes. He is pretty sure he bit his tongue and urinated on himself. He was very slow and groggy after he stopped shaking.    ED Course: brought via EMS. When EMS arrived on ground and in vtach, combative with EMS and got total of 5mg  of IV versed. Glucose: 236, ekg showed atrial fibrillation. Arrival to ER: bp: 162/110, HR: 109, RR: 22, oxygen: 95% on room air. Significant labs: 20 ketones in urine, >500 glucose, WBC: 16.9, creatinine: 1.70, troponin: 48, magnesium: 1.6, lactic acid: 3.6 CTH: meningioma. No acute findings. CXR; cardiomegaly.  Given 2L bolus of fluids, magnesium repletion and 1mg  ativan and loaded with keppra. Neurology was consulted and asked for eeg and MRI. We were asked to admit.   Review of Systems: As per HPI; otherwise review of systems reviewed and negative.   Ambulatory Status:  Ambulates without assistance  COVID Vaccine Status:  moderna x2 and 1 booster.   No past medical history on file. Medical history reviewed with his daughter.   Social History   Socioeconomic History   Marital status: Married    Spouse name: Not on file   Number of children: Not on file   Years of education: Not on file   Highest education level: Not on file  Occupational History   Not on file  Tobacco Use   Smoking status: Not on file   Smokeless tobacco: Not on file  Substance and Sexual Activity   Alcohol use: Not on file   Drug use: Not on file   Sexual activity: Not on file  Other Topics Concern   Not on file  Social History Narrative   Not on file   Social Determinants of Health   Financial Resource Strain: Not on file  Food Insecurity: Not on file  Transportation Needs: Not on file  Physical Activity: Not on file  Stress: Not on file  Social Connections: Not on file  Intimate Partner Violence: Not on file    No Known  Allergies  No family history on file.  Prior to Admission medications   Not on File    Physical Exam: Vitals:   08/25/20 1630 08/25/20 1645 08/25/20 1800 08/25/20 1815  BP: 130/89 (!) 164/99 122/73 136/80  Pulse: 87 94 (!) 110 (!) 107  Resp: 20 (!) 22 20 (!) 25  Temp:      TempSrc:      SpO2: 97% 99% 96% 96%     General:  Appears calm, but agitated with the mittens. NAD Eyes:  PERRL, EOMI, normal lids, iris. Keeps eyes closed.  ENT:  grossly normal hearing, lips & tongue, dry mucous membranes; appropriate dentition Neck:  no LAD, masses or thyromegaly; no carotid bruits Cardiovascular:  RRR, systolic murmur.  No LE edema.  Respiratory:   CTA bilaterally with no  wheezes/rales/rhonchi.  Normal respiratory effort. Abdomen:  soft, NT, ND, NABS Back:   normal alignment, no CVAT Skin:  no rash or induration seen on limited exam Musculoskeletal:  grossly normal tone BUE/BLE, good ROM, no bony abnormality Lower extremity:  No LE edema.  Limited foot exam with no ulcerations.  2+ distal pulses. Psychiatric:   can follow directions, but is confused at times.  Alert to person and hospital, but does not know which hospital he is at or what city he is in. Knows his son.  Neurologic:  CN 2-12 grossly intact-limited exam.  moves all extremities in coordinated fashion, sensation intact    Radiological Exams on Admission: Independently reviewed - see discussion in A/P where applicable  CT Head Wo Contrast  Result Date: 08/25/2020 CLINICAL DATA:  80 year old male with altered mental status, headache and possible seizure. EXAM: CT HEAD WITHOUT CONTRAST CT CERVICAL SPINE WITHOUT CONTRAST TECHNIQUE: Multidetector CT imaging of the head and cervical spine was performed following the standard protocol without intravenous contrast. Multiplanar CT image reconstructions of the cervical spine were also generated. COMPARISON:  None. FINDINGS: CT HEAD FINDINGS Brain: A 1.8 x 1.2 x 2 cm partially calcified extra-axial posterior RIGHT posterior parietal mass is noted compatible with a meningioma. No adjacent parenchymal edema is noted. Atrophy and mild chronic small-vessel white matter ischemic changes are noted. There is no evidence of acute infarct, hemorrhage, midline shift or hydrocephalus. Vascular: Carotid and basilar atherosclerotic calcifications are noted. Skull: No acute or suspicious abnormalities. Sinuses/Orbits: Opacified RIGHT maxillary sinus noted. Other: None CT CERVICAL SPINE FINDINGS Alignment: Normal. Skull base and vertebrae: No acute fracture. No primary bone lesion or focal pathologic process. Soft tissues and spinal canal: No prevertebral fluid or swelling. No  visible canal hematoma. Disc levels: Moderate degenerative disc disease/spondylosis at C5-6 and C6-7 noted. Facet arthropathy within the cervical spine also identified. Upper chest: No acute abnormality Other: None IMPRESSION: 1. No evidence of acute intracranial abnormality. Atrophy and chronic small-vessel white matter ischemic changes. 2. 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 No adjacent parenchymal edema. 3. No static evidence of acute injury to the cervical spine. Electronically Signed   By: Margarette Canada M.D.   On: 08/25/2020 15:17   CT Cervical Spine Wo Contrast  Result Date: 08/25/2020 CLINICAL DATA:  80 year old male with altered mental status, headache and possible seizure. EXAM: CT HEAD WITHOUT CONTRAST CT CERVICAL SPINE WITHOUT CONTRAST TECHNIQUE: Multidetector CT imaging of the head and cervical spine was performed following the standard protocol without intravenous contrast. Multiplanar CT image reconstructions of the cervical spine were also generated. COMPARISON:  None. FINDINGS: CT HEAD FINDINGS Brain: A 1.8 x 1.2  x 2 cm partially calcified extra-axial posterior RIGHT posterior parietal mass is noted compatible with a meningioma. No adjacent parenchymal edema is noted. Atrophy and mild chronic small-vessel white matter ischemic changes are noted. There is no evidence of acute infarct, hemorrhage, midline shift or hydrocephalus. Vascular: Carotid and basilar atherosclerotic calcifications are noted. Skull: No acute or suspicious abnormalities. Sinuses/Orbits: Opacified RIGHT maxillary sinus noted. Other: None CT CERVICAL SPINE FINDINGS Alignment: Normal. Skull base and vertebrae: No acute fracture. No primary bone lesion or focal pathologic process. Soft tissues and spinal canal: No prevertebral fluid or swelling. No visible canal hematoma. Disc levels: Moderate degenerative disc disease/spondylosis at C5-6 and C6-7 noted. Facet arthropathy within the cervical spine also identified.  Upper chest: No acute abnormality Other: None IMPRESSION: 1. No evidence of acute intracranial abnormality. Atrophy and chronic small-vessel white matter ischemic changes. 2. 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 No adjacent parenchymal edema. 3. No static evidence of acute injury to the cervical spine. Electronically Signed   By: Margarette Canada M.D.   On: 08/25/2020 15:17   DG Chest Portable 1 View  Result Date: 08/25/2020 CLINICAL DATA:  Altered mental status EXAM: PORTABLE CHEST 1 VIEW COMPARISON:  None. FINDINGS: Cardiomegaly and CABG changes noted. This is a low volume study. There is no evidence of focal airspace disease, pulmonary edema, suspicious pulmonary nodule/mass, pleural effusion, or pneumothorax. No acute bony abnormalities are identified. IMPRESSION: Cardiomegaly without evidence of acute cardiopulmonary disease. Electronically Signed   By: Margarette Canada M.D.   On: 08/25/2020 16:40    EKG: Independently reviewed.  Atrial fibrillation with rate of 113 and LBBB; nonspecific ST changes with no evidence of acute ischemia. No prior tracings to compare this to.    Labs on Admission: I have personally reviewed the available labs and imaging studies at the time of the admission.  Pertinent labs:  20 ketones in urine, >500 glucose  WBC: 16.9 creatinine: 1.70 troponin: 48--->140 magnesium: 1.6 lactic acid: 3.6--->1.9 CTH: meningioma. No acute findings.  CXR; cardiomegaly.    Assessment/Plan Principal Problem:   Seizure-like activity ([place]) -witnessed possible seizure by family x 7 minutes  -loaded with keppra in ER -neurology recommended MRI/EEG.  -does have meningioma ? If contributing. F/u with neuro recommendations.  -seizure precautions and NPO until no longer confused.  -labs pending including TSH, ammonia, ethanol, b12, -cultures pending. Lactic acid initially elevated, but likely due more to dehydration. Repeat wnl.  -telemetry   Active Problems:   Acute  metabolic encephalopathy  -seizure/meningioma vs. Infection vs. Metabolic vs. Cardiac large differential -cultures pending, lactic acid repeat wnl and likely elevated due to dehydration -WBC elevated, but again I think more hemoconcentrated. No source of infection, f/u on cultures -metabolic labs pending -UDS +for BZD given here -NPO until more alert and oriented.  -continue to follow clinically     AKI (acute kidney injury) ([place]) -likely prerenal. Very dry on exam -bolused 2L in ER. Does have cardiomegaly on CXR. No known hx of CHF, but history limited.  -echo pending, light IVF hydration overnight at 42ml/hour.  -held his ACEi/HCTZ bp drug -repeat in AM    Atrial fibrillation with RVR ([place]) -was back in NSR while I examined him.  -no hx of afib per daughter, was followed by cardiology in [place] score of 5 -cardiology consulted, anticoagulation up to them -started metoprolol 25mg  BID per cardiology -continue telemetry monitoring -I/O and daily weights  -echo pending     Elevated troponin -  slight trend upward: 48--->140 -?if demand ischemia with possible seizure/dehydration/AKI -BNP pending -cardiology consulted -having no active chest pain or changes on EK -started on beta blocker and continue statin    Hypomagnesemia -repleted in ER -recheck in AM    Leukocytosis -again I think more hemoconcentrated -cultures pending, no fever.  -IVF overnight and repeat CBC in AM    HTN (hypertension) -holding home medication with AKI (zestoretic) -started on metoprolol per cards.   CAD (coronary artery disease -hx of CABG x 5 in 2003 -telemetry -started statin per cardiology -continue statin    Type 2 diabetes mellitus without complication ([place]) -L9R pending -hold metformin -on levemir 18 units BID and novolog 12 units TID.  Will only give 9 units of his levemir tonight since NPO -add back insulin tomorrow as only have written 9 units QHS.  -SSI and  accuchecks     HLD (hyperlipidemia) -lipid pending -continue statin     BPH (benign prostatic hyperplasia) Appears stable Continue home medication     There is no height or weight on file to calculate BMI.   Level of care: Telemetry Medical DVT prophylaxis:  Lovenox  Code Status:  Full - unable to confirm with patient and family does not know  Family Communication: None present. Discussed over phone with his daughter: Kycen Spalla [phone] Disposition Plan:  The patient is from: home   Patient is currently: acutely ill requiring inpatient hospitalization for work up and treatment of multiple issues.  Consults called: neurology and Cardiology consulted.   Admission status:  inpatient    Orma Flaming MD [facility]   How to contact the [facility] Attending or Consulting provider [place] or covering provider during after hours [place], for this patient?  Check the care team in [facility] and look for a) attending/consulting [facility] provider listed and b) the [facility] team listed Log into [email or web address] and use [place] universal password to access. If you do not have the password, please contact the hospital operator. Locate the [facility] provider you are looking for under [facility] and page to a number that you can be directly reached. If you still have difficulty reaching the provider, please page the [facility] (Director on Call) for the Hospitalists listed on amion for assistance.   08/25/2020, 6:49 PM

## 2020-08-26 ENCOUNTER — Inpatient Hospital Stay (HOSPITAL_COMMUNITY): Payer: No Typology Code available for payment source

## 2020-08-26 DIAGNOSIS — I351 Nonrheumatic aortic (valve) insufficiency: Secondary | ICD-10-CM

## 2020-08-26 DIAGNOSIS — R569 Unspecified convulsions: Principal | ICD-10-CM

## 2020-08-26 DIAGNOSIS — I340 Nonrheumatic mitral (valve) insufficiency: Secondary | ICD-10-CM

## 2020-08-26 DIAGNOSIS — R55 Syncope and collapse: Secondary | ICD-10-CM

## 2020-08-26 DIAGNOSIS — I350 Nonrheumatic aortic (valve) stenosis: Secondary | ICD-10-CM

## 2020-08-26 DIAGNOSIS — G9341 Metabolic encephalopathy: Secondary | ICD-10-CM

## 2020-08-26 DIAGNOSIS — N179 Acute kidney failure, unspecified: Secondary | ICD-10-CM

## 2020-08-26 DIAGNOSIS — I4891 Unspecified atrial fibrillation: Secondary | ICD-10-CM

## 2020-08-26 LAB — CBC
HCT: 33.2 % — ABNORMAL LOW (ref 39.0–52.0)
Hemoglobin: 10.8 g/dL — ABNORMAL LOW (ref 13.0–17.0)
MCH: 29.9 pg (ref 26.0–34.0)
MCHC: 32.5 g/dL (ref 30.0–36.0)
MCV: 92 fL (ref 80.0–100.0)
Platelets: 220 10*3/uL (ref 150–400)
RBC: 3.61 MIL/uL — ABNORMAL LOW (ref 4.22–5.81)
RDW: 13.6 % (ref 11.5–15.5)
WBC: 8.9 10*3/uL (ref 4.0–10.5)
nRBC: 0 % (ref 0.0–0.2)

## 2020-08-26 LAB — COMPREHENSIVE METABOLIC PANEL
ALT: 17 U/L (ref 0–44)
AST: 26 U/L (ref 15–41)
Albumin: 3 g/dL — ABNORMAL LOW (ref 3.5–5.0)
Alkaline Phosphatase: 57 U/L (ref 38–126)
Anion gap: 8 (ref 5–15)
BUN: 16 mg/dL (ref 8–23)
CO2: 21 mmol/L — ABNORMAL LOW (ref 22–32)
Calcium: 8.7 mg/dL — ABNORMAL LOW (ref 8.9–10.3)
Chloride: 108 mmol/L (ref 98–111)
Creatinine, Ser: 1.39 mg/dL — ABNORMAL HIGH (ref 0.61–1.24)
GFR, Estimated: 51 mL/min — ABNORMAL LOW (ref >60)
Glucose, Bld: 189 mg/dL — ABNORMAL HIGH (ref 70–99)
Potassium: 3.5 mmol/L (ref 3.5–5.1)
Sodium: 137 mmol/L (ref 135–145)
Total Bilirubin: 1 mg/dL (ref 0.3–1.2)
Total Protein: 5.7 g/dL — ABNORMAL LOW (ref 6.5–8.1)

## 2020-08-26 LAB — MAGNESIUM: Magnesium: 1.7 mg/dL (ref 1.7–2.4)

## 2020-08-26 LAB — ECHOCARDIOGRAM COMPLETE
AR max vel: 0.89 cm2
AV Area VTI: 0.92 cm2
AV Area mean vel: 0.88 cm2
AV Mean grad: 17 mmHg
AV Peak grad: 28.9 mmHg
Ao pk vel: 2.69 m/s
Area-P 1/2: 3.42 cm2
P 1/2 time: 714 msec
S' Lateral: 4.5 cm

## 2020-08-26 LAB — CBG MONITORING, ED
Glucose-Capillary: 168 mg/dL — ABNORMAL HIGH (ref 70–99)
Glucose-Capillary: 138 mg/dL — ABNORMAL HIGH (ref 70–99)

## 2020-08-26 LAB — URINE CULTURE

## 2020-08-26 LAB — GLUCOSE, CAPILLARY
Glucose-Capillary: 178 mg/dL — ABNORMAL HIGH (ref 70–99)
Glucose-Capillary: 114 mg/dL — ABNORMAL HIGH (ref 70–99)

## 2020-08-26 LAB — HEMOGLOBIN A1C
Hgb A1c MFr Bld: 8.4 % — ABNORMAL HIGH (ref 4.8–5.6)
Mean Plasma Glucose: 194.38 mg/dL

## 2020-08-26 MED ORDER — MAGNESIUM SULFATE 2 GM/50ML IV SOLN
2.0000 g | Freq: Once | INTRAVENOUS | Status: AC
  Administered 2020-08-26: 2 g via INTRAVENOUS
  Filled 2020-08-26: qty 50

## 2020-08-26 MED ORDER — FINASTERIDE 5 MG PO TABS
5.0000 mg | ORAL_TABLET | Freq: Every day | ORAL | Status: DC
  Administered 2020-08-26 – 2020-08-27 (×2): 5 mg via ORAL
  Filled 2020-08-26 (×2): qty 1

## 2020-08-26 MED ORDER — TAMSULOSIN HCL 0.4 MG PO CAPS
0.4000 mg | ORAL_CAPSULE | Freq: Every day | ORAL | Status: DC
  Administered 2020-08-26 – 2020-08-27 (×2): 0.4 mg via ORAL
  Filled 2020-08-26 (×2): qty 1

## 2020-08-26 MED ORDER — GADOBUTROL 1 MMOL/ML IV SOLN
8.0000 mL | Freq: Once | INTRAVENOUS | Status: AC | PRN
  Administered 2020-08-26: 8 mL via INTRAVENOUS

## 2020-08-26 MED ORDER — ONDANSETRON HCL 4 MG/2ML IJ SOLN: 4.0000 mg | Freq: Four times a day (QID) | INTRAMUSCULAR | Status: DC | PRN

## 2020-08-26 MED ORDER — LEVETIRACETAM 500 MG PO TABS
500.0000 mg | ORAL_TABLET | Freq: Two times a day (BID) | ORAL | Status: DC
  Administered 2020-08-26 – 2020-08-27 (×3): 500 mg via ORAL
  Filled 2020-08-26 (×3): qty 1

## 2020-08-26 MED ORDER — SODIUM CHLORIDE 0.9 % IV SOLN: INTRAVENOUS | Status: AC

## 2020-08-26 MED ORDER — CYANOCOBALAMIN 1000 MCG/ML IJ SOLN
1000.0000 ug | Freq: Every day | INTRAMUSCULAR | Status: DC
  Administered 2020-08-27: 1000 ug via INTRAMUSCULAR
  Filled 2020-08-26: qty 1

## 2020-08-26 MED ORDER — VITAMIN B-12 1000 MCG PO TABS: 1000.0000 ug | ORAL_TABLET | Freq: Every day | ORAL | Status: DC

## 2020-08-26 MED ORDER — PNEUMOCOCCAL VAC POLYVALENT 25 MCG/0.5ML IJ INJ
0.5000 mL | INJECTION | INTRAMUSCULAR | Status: DC
  Filled 2020-08-26: qty 0.5

## 2020-08-26 MED ORDER — ASPIRIN 81 MG PO CHEW
81.0000 mg | CHEWABLE_TABLET | Freq: Every day | ORAL | Status: DC
  Administered 2020-08-26 – 2020-08-27 (×2): 81 mg via ORAL
  Filled 2020-08-26 (×2): qty 1

## 2020-08-26 MED ORDER — ATORVASTATIN CALCIUM 40 MG PO TABS
40.0000 mg | ORAL_TABLET | Freq: Every day | ORAL | Status: DC
  Administered 2020-08-26 – 2020-08-27 (×2): 40 mg via ORAL
  Filled 2020-08-26 (×2): qty 1

## 2020-08-26 MED ORDER — ACETAMINOPHEN 325 MG PO TABS: 650.0000 mg | ORAL_TABLET | Freq: Four times a day (QID) | ORAL | Status: DC | PRN

## 2020-08-26 NOTE — Progress Notes (Addendum)
PROGRESS NOTE        PATIENT DETAILS Name: Erik Hines Age: 80 y.o. Sex: male Date of Birth: 09/06/1940 Admit Date: 08/25/2020 Admitting Physician Orma Flaming, MD PCP:Pcp, No  Brief Narrative: Patient is a 80 y.o. male with history of CAD s/p CABG in 2003, HTN, DM-2, BPH, HLD-who presented to the hospital with new onset seizures and A. fib with RVR.  See below for further details.    Significant events: 7/10>> admit for eval-new onset seizures-brief A. fib with RVR  Significant studies: 7/10>> CXR: No pneumonia 7/10>> CT head: No acute intracranial abnormality, right parietal meningioma. 7/10>> CT C-spine: No fracture/dislocation. 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  Completely awake and alert.   Assessment/Plan: New onset seizures: No further seizures since admission-work-up in progress-MRI brain with right parietal lobe meningioma with mass-effect but no edema-awaiting EEG and further input from neurology.  Altered mental status due to postictal state: Doubt metabolic encephalopathy-suspect this was confusion from postictal state-he is completely awake and alert.  AKI: Hemodynamically mediated-improving-follow periodically-continue gentle IVF.  Paroxysmal A. fib with RVR: Spontaneously converted back to sinus rhythm-briefly started on metoprolol but stopped due to bradycardia.  CHA2DS2-VASc of 4-we will await neurology opinion before starting anticoagulation.  Appreciate cardiology input.  Leukocytosis: Likely stress  margination-resolved-no indication of infection.  Monitor CBC periodically  CAD s/p CABG: No anginal symptoms-continue aspirin/statin  Aortic stenosis: Await echo-sounds like patient presented with seizures rather than syncope.  Type 2 diabetes mellitus(A1c 8.4 on 7/11): CBGs relatively stable-continue Levemir chess and SSI.  Follow and adjust  Recent Labs    08/25/20 2217 08/26/20 0838  GLUCAP 210* 168*    HTN: BP relatively stable-not on any antihypertensives-lisinopril/HCTZ on hold.  HLD: Continue statin  BPH: Continue Flomax   Diet: Diet Order             Diet heart healthy/carb modified Room service appropriate? Yes; Fluid consistency: Thin  Diet effective now                    Code Status: Full code   Family Communication: Daughter-Beth-[phone]-voicemail left on 7/11  Addendum: daughter Beth-called back  Disposition Plan: Status is: Inpatient  Remains inpatient appropriate because:Inpatient level of care appropriate due to severity of illness  Dispo: The patient is from: Home              Anticipated d/c is to: Home              Patient currently is not medically stable to d/c.   Difficult to place patient No    Barriers to Discharge: New onset seizures-A. fib with RVR-work-up in progress.  Antimicrobial agents: Anti-infectives (From admission, onward)    None        Time spent: 35 minutes-Greater than 50% of this time was spent in counseling, explanation of diagnosis, planning of further management, and coordination of care.  MEDICATIONS: Scheduled Meds:  aspirin  81 mg Oral Daily   atorvastatin  40  mg Oral Daily   enoxaparin (LOVENOX) injection  40 mg Subcutaneous Q24H   finasteride  5 mg Oral Daily   insulin aspart  0-9 Units Subcutaneous TID WC   insulin detemir  9 Units Subcutaneous QHS   tamsulosin  0.4 mg Oral Daily   Continuous Infusions: PRN Meds:.   PHYSICAL EXAM: Vital signs: Vitals:   08/26/20 0700 08/26/20  0830 08/26/20 0900 08/26/20 1000  BP: (!) 154/50 (!) 154/61 (!) 156/89 (!) 145/60  Pulse: (!) 55 (!) 54 (!) 56 62  Resp: 17 14 16 20   Temp:      TempSrc:      SpO2: 100% 100% 100% 97%   There were no vitals filed for this visit. There is no height or weight on file to calculate BMI.   Gen Exam:Alert awake-not in any distress HEENT:atraumatic, normocephalic Chest: B/L clear to auscultation anteriorly JGO:T1X7 regular-+ systolic murmur Abdomen:soft non tender, non distended Extremities:no edema Neurology: Non focal Skin: no rash  I have personally reviewed following labs and imaging studies  LABORATORY DATA: CBC: Recent Labs  Lab 08/25/20 1445 08/25/20 1511 08/26/20 0354  WBC 16.9*  --  8.9  NEUTROABS 15.4*  --   --   HGB 13.1 13.6  14.3 10.8*  HCT 40.6 40.0  42.0 33.2*  MCV 92.1  --  92.0  PLT 275  --  262    Basic Metabolic Panel: Recent Labs  Lab 08/25/20 1445 08/25/20 1511 08/26/20 0354  NA 135 137  136 137  K 3.5 3.6  3.6 3.5  CL 102 105 108  CO2 20*  --  21*  GLUCOSE 334* 339* 189*  BUN 19 20 16   CREATININE 1.70* 1.60* 1.39*  CALCIUM 9.4  --  8.7*  MG 1.6*  --  1.7    GFR: CrCl cannot be calculated (Unknown ideal weight.).  Liver Function Tests: Recent Labs  Lab 08/25/20 1445 08/26/20 0354  AST 30 26  ALT 20 17  ALKPHOS 70 57  BILITOT 1.0 1.0  PROT 7.3 5.7*  ALBUMIN 3.9 3.0*   No results for input(s): LIPASE, AMYLASE in the last 168 hours. Recent Labs  Lab 08/25/20 1907  AMMONIA <9*    Coagulation Profile: No results for input(s): INR, PROTIME in the last 168 hours.  Cardiac Enzymes: Recent Labs  Lab 08/25/20 1906  CKTOTAL 187    BNP (last 3 results) No results for input(s): PROBNP in the last 8760 hours.  Lipid Profile: No results for input(s): CHOL, HDL, LDLCALC, TRIG, CHOLHDL, LDLDIRECT in the last 72 hours.  Thyroid Function Tests: Recent Labs    08/25/20 1905  TSH 1.680    Anemia Panel: Recent Labs     08/25/20 1906  VITAMINB12 79*    Urine analysis:    Component Value Date/Time   COLORURINE YELLOW 08/25/2020 1443   APPEARANCEUR CLEAR 08/25/2020 1443   LABSPEC 1.018 08/25/2020 1443   PHURINE 5.0 08/25/2020 1443   GLUCOSEU >=500 (A) 08/25/2020 1443   HGBUR SMALL (A) 08/25/2020 1443   BILIRUBINUR NEGATIVE 08/25/2020 1443   KETONESUR 20 (A) 08/25/2020 1443   PROTEINUR 100 (A) 08/25/2020 1443   NITRITE NEGATIVE 08/25/2020 1443   LEUKOCYTESUR NEGATIVE 08/25/2020 1443    Sepsis Labs: Lactic Acid, Venous    Component Value Date/Time   LATICACIDVEN 1.9 08/25/2020 1655    MICROBIOLOGY: Recent Results (from the past 240 hour(s))  Resp Panel by RT-PCR (Flu A&B, Covid) Nasopharyngeal Swab     Status: None  Collection Time: 08/25/20  2:41 PM   Specimen: Nasopharyngeal Swab; Nasopharyngeal(NP) swabs in vial transport medium  Result Value Ref Range Status   SARS Coronavirus 2 by RT PCR NEGATIVE NEGATIVE Final    Comment: (NOTE) SARS-CoV-2 target nucleic acids are NOT DETECTED.  The SARS-CoV-2 RNA is generally detectable in upper respiratory specimens during the acute phase of infection. The lowest concentration of SARS-CoV-2 viral copies this assay can detect is 138 copies/mL. A negative result does not preclude SARS-Cov-2 infection and should not be used as the sole basis for treatment or other patient management decisions. A negative result may occur with  improper specimen collection/handling, submission of specimen other than nasopharyngeal swab, presence of viral mutation(s) within the areas targeted by this assay, and inadequate number of viral copies(<138 copies/mL). A negative result must be combined with clinical observations, patient history, and epidemiological information. The expected result is Negative.  Fact Sheet for Patients:  EntrepreneurPulse.com.au  Fact Sheet for Healthcare Providers:  IncredibleEmployment.be  This  test is no t yet approved or cleared by the [place] FDA and  has been authorized for detection and/or diagnosis of SARS-CoV-2 by FDA under an Emergency Use Authorization (EUA). This EUA will remain  in effect (meaning this test can be used) for the duration of the COVID-19 declaration under Section 564(b)(1) of the Act, 21 U.S.C.section 360bbb-3(b)(1), unless the authorization is terminated  or revoked sooner.       Influenza A by PCR NEGATIVE NEGATIVE Final   Influenza B by PCR NEGATIVE NEGATIVE Final    Comment: (NOTE) The Xpert Xpress SARS-CoV-2/FLU/RSV plus assay is intended as an aid in the diagnosis of influenza from Nasopharyngeal swab specimens and should not be used as a sole basis for treatment. Nasal washings and aspirates are unacceptable for Xpert Xpress SARS-CoV-2/FLU/RSV testing.  Fact Sheet for Patients: EntrepreneurPulse.com.au  Fact Sheet for Healthcare Providers: IncredibleEmployment.be  This test is not yet approved or cleared by the [place] FDA and has been authorized for detection and/or diagnosis of SARS-CoV-2 by FDA under an Emergency Use Authorization (EUA). This EUA will remain in effect (meaning this test can be used) for the duration of the COVID-19 declaration under Section 564(b)(1) of the Act, 21 U.S.C. section 360bbb-3(b)(1), unless the authorization is terminated or revoked.  Performed at [facility], [place] [address], [place], [place] [address]   Blood culture (routine x 2)     Status: None (Preliminary result)   Collection Time: 08/25/20  3:20 PM   Specimen: BLOOD  Result Value Ref Range Status   Specimen Description BLOOD RIGHT ANTECUBITAL  Final   Special Requests   Final    BOTTLES DRAWN AEROBIC AND ANAEROBIC Blood Culture results may not be optimal due to an excessive volume of blood received in culture bottles   Culture   Final    NO GROWTH < 24 HOURS Performed at [facility], [place] [address] [place], [place] [address]    Report Status PENDING  Incomplete  Blood culture (routine x 2)     Status: None (Preliminary result)   Collection Time: 08/25/20  4:55 PM   Specimen: BLOOD LEFT WRIST  Result Value Ref Range Status   Specimen Description BLOOD LEFT WRIST  Final   Special Requests   Final    BOTTLES DRAWN AEROBIC AND ANAEROBIC Blood Culture adequate volume   Culture   Final    NO GROWTH < 24 HOURS Performed at [facility],  [address] [place], [place] [address]    Report Status PENDING  Incomplete    RADIOLOGY STUDIES/RESULTS: CT Head Wo Contrast  Result Date: 08/25/2020 CLINICAL DATA:  80 year old male with altered mental status, headache and possible seizure. EXAM: CT HEAD WITHOUT CONTRAST CT CERVICAL SPINE WITHOUT CONTRAST TECHNIQUE: Multidetector CT imaging of the head and cervical spine was performed following the standard protocol without intravenous contrast. Multiplanar CT image reconstructions of the cervical spine were also generated. COMPARISON:  None. FINDINGS: CT HEAD FINDINGS Brain: A 1.8 x 1.2 x 2 cm partially calcified extra-axial posterior RIGHT posterior parietal mass is noted compatible with a meningioma. No adjacent parenchymal edema is noted. Atrophy and mild chronic small-vessel white matter ischemic changes are noted. There is no evidence of acute infarct, hemorrhage, midline shift or hydrocephalus. Vascular: Carotid and basilar atherosclerotic calcifications are noted. Skull: No acute or suspicious abnormalities. Sinuses/Orbits: Opacified RIGHT maxillary sinus noted. Other: None CT CERVICAL SPINE FINDINGS Alignment: Normal. Skull base and vertebrae: No acute fracture. No primary bone lesion or focal pathologic process. Soft tissues and spinal canal: No prevertebral fluid or swelling. No visible canal hematoma. Disc levels: Moderate degenerative disc disease/spondylosis at C5-6 and C6-7 noted. Facet arthropathy within the  cervical spine also identified. Upper chest: No acute abnormality Other: None IMPRESSION: 1. No evidence of acute intracranial abnormality. Atrophy and chronic small-vessel white matter ischemic changes. 2. 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 No adjacent parenchymal edema. 3. No static evidence of acute injury to the cervical spine. Electronically Signed   By: Margarette Canada M.D.   On: 08/25/2020 15:17   CT Cervical Spine Wo Contrast  Result Date: 08/25/2020 CLINICAL DATA:  80 year old male with altered mental status, headache and possible seizure. EXAM: CT HEAD WITHOUT CONTRAST CT CERVICAL SPINE WITHOUT CONTRAST TECHNIQUE: Multidetector CT imaging of the head and cervical spine was performed following the standard protocol without intravenous contrast. Multiplanar CT image reconstructions of the cervical spine were also generated. COMPARISON:  None. FINDINGS: CT HEAD FINDINGS Brain: A 1.8 x 1.2 x 2 cm partially calcified extra-axial posterior RIGHT posterior parietal mass is noted compatible with a meningioma. No adjacent parenchymal edema is noted. Atrophy and mild chronic small-vessel white matter ischemic changes are noted. There is no evidence of acute infarct, hemorrhage, midline shift or hydrocephalus. Vascular: Carotid and basilar atherosclerotic calcifications are noted. Skull: No acute or suspicious abnormalities. Sinuses/Orbits: Opacified RIGHT maxillary sinus noted. Other: None CT CERVICAL SPINE FINDINGS Alignment: Normal. Skull base and vertebrae: No acute fracture. No primary bone lesion or focal pathologic process. Soft tissues and spinal canal: No prevertebral fluid or swelling. No visible canal hematoma. Disc levels: Moderate degenerative disc disease/spondylosis at C5-6 and C6-7 noted. Facet arthropathy within the cervical spine also identified. Upper chest: No acute abnormality Other: None IMPRESSION: 1. No evidence of acute intracranial abnormality. Atrophy and chronic  small-vessel white matter ischemic changes. 2. 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 No adjacent parenchymal edema. 3. No static evidence of acute injury to the cervical spine. Electronically Signed   By: Margarette Canada M.D.   On: 08/25/2020 15:17   MR Brain W and Wo Contrast  Result Date: 08/26/2020 CLINICAL DATA:  Seizure, nontraumatic. EXAM: MRI HEAD WITHOUT AND WITH CONTRAST TECHNIQUE: Multiplanar, multiecho pulse sequences of the brain and surrounding structures were obtained without and with intravenous contrast. CONTRAST:  53mL GADAVIST GADOBUTROL 1 MMOL/ML IV SOLN COMPARISON:  Head CT 08/25/2020. FINDINGS: Brain: Moderate  generalized cerebral atrophy. Commensurate prominence of the ventricles and sulci. Comparatively mild cerebellar atrophy. 2.0 x 2.0 x 2.0 cm enhancing mass overlying the right parietal lobe, abutting the posterior aspect of the superior sagittal sinus, likely reflecting a meningioma (series 10, image 41) (series 11, image 5). Local mass effect upon the a underlying right parietal lobe with mild underlying parenchymal edema (series 6, image 25). No ventricular effacement or midline shift. Mild multifocal T2/FLAIR hyperintensity within the cerebral white matter, nonspecific but compatible with chronic small vessel ischemic disease. T2/FLAIR sequences oriented to the perpendicular axis of the hippocampi were not acquired. There is no acute infarct. No chronic intracranial blood products. No extra-axial fluid collection. No midline shift. Partially empty sella turcica. Vascular: Expected proximal arterial flow voids. Non dominant intracranial left vertebral artery. Skull and upper cervical spine: Normal marrow signal. Sinuses/Orbits: Visualized orbits show no acute finding. Complete opacification of the right maxillary sinus. No more than mild paranasal sinus mucosal thickening elsewhere. Other: Small right mastoid effusion. IMPRESSION: 2.0 x 2.0 enhancing extra-axial mass  overlying the right parietal lobe, most compatible with meningioma. Local mass effect upon the underlying right parietal lobe with mild underlying parenchymal edema. The mass abuts the posterior aspect of the superior sagittal sinus. 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 Moderate generalized cerebral atrophy. Comparatively mild cerebellar atrophy. Paranasal sinus disease, most notably severe right maxillary sinusitis. Small right mastoid effusion. Electronically Signed   By: Kellie Simmering DO   On: 08/26/2020 08:30   DG Chest Portable 1 View  Result Date: 08/25/2020 CLINICAL DATA:  Altered mental status EXAM: PORTABLE CHEST 1 VIEW COMPARISON:  None. FINDINGS: Cardiomegaly and CABG changes noted. This is a low volume study. There is no evidence of focal airspace disease, pulmonary edema, suspicious pulmonary nodule/mass, pleural effusion, or pneumothorax. No acute bony abnormalities are identified. IMPRESSION: Cardiomegaly without evidence of acute cardiopulmonary disease. Electronically Signed   By: Margarette Canada M.D.   On: 08/25/2020 16:40     LOS: 1 day   Oren Binet, MD  [facility]    To contact the attending provider between 7A-7P or the covering provider during after hours 7P-7A, please log into the web site [email or web address] and access using universal [place] password for that web site. If you do not have the password, please call the hospital operator.  08/26/2020, 10:46 AM

## 2020-08-26 NOTE — Progress Notes (Signed)
  Echocardiogram 2D Echocardiogram has been performed.  Kevaughn Ewing G Karey Stucki 08/26/2020, 11:59 AM

## 2020-08-26 NOTE — Progress Notes (Signed)
Progress Note  Patient Name: Erik Hines Date of Encounter: 08/26/2020  [facility] Cardiologist: None New to Dr. Audie Box   Subjective   No recurrent atrial fibrillation.  Recently moved from [place].  No chest pain or shortness of breath.  Had a mechanical fall 2 weeks ago.  He takes care of disabled wife.  Inpatient Medications    Scheduled Meds:  aspirin  81 mg Oral Daily   atorvastatin  40 mg Oral Daily   enoxaparin (LOVENOX) injection  40 mg Subcutaneous Q24H   finasteride  5 mg Oral Daily   insulin aspart  0-9 Units Subcutaneous TID WC   insulin detemir  9 Units Subcutaneous QHS   metoprolol tartrate  25 mg Oral BID   tamsulosin  0.4 mg Oral Daily   Continuous Infusions:  magnesium sulfate bolus IVPB     PRN Meds:    Vital Signs    Vitals:   08/26/20 0630 08/26/20 0645 08/26/20 0700 08/26/20 0830  BP: 101/80  (!) 154/50 (!) 154/61  Pulse: 68 (!) 54 (!) 55 (!) 54  Resp:  18 17 14   Temp:      TempSrc:      SpO2: 99% 100% 100% 100%    Intake/Output Summary (Last 24 hours) at 08/26/2020 0846 Last data filed at 08/25/2020 2316 Gross per 24 hour  Intake 2150 ml  Output 100 ml  Net 2050 ml   No flowsheet data found.    Telemetry    Sinus bradycardia- Personally Reviewed  ECG    N/A  Physical Exam   GEN: No acute distress.   Neck: No JVD Cardiac: RRR, 3/6 systolic murmurs, rubs, or gallops.  Respiratory: Clear to auscultation bilaterally. GI: Soft, nontender, non-distended  MS: No edema; No deformity. Neuro:  Nonfocal  Psych: Normal affect   Labs    High Sensitivity Troponin:   Recent Labs  Lab 08/25/20 1445 08/25/20 1655  TROPONINIHS 48* 140*      Chemistry Recent Labs  Lab 08/25/20 1445 08/25/20 1511 08/26/20 0354  NA 135 137  136 137  K 3.5 3.6  3.6 3.5  CL 102 105 108  CO2 20*  --  21*  GLUCOSE 334* 339* 189*  BUN 19 20 16   CREATININE 1.70* 1.60* 1.39*  CALCIUM 9.4  --  8.7*  PROT 7.3  --  5.7*  ALBUMIN 3.9   --  3.0*  AST 30  --  26  ALT 20  --  17  ALKPHOS 70  --  57  BILITOT 1.0  --  1.0  GFRNONAA 40*  --  51*  ANIONGAP 13  --  8     Hematology Recent Labs  Lab 08/25/20 1445 08/25/20 1511 08/26/20 0354  WBC 16.9*  --  8.9  RBC 4.41  --  3.61*  HGB 13.1 13.6  14.3 10.8*  HCT 40.6 40.0  42.0 33.2*  MCV 92.1  --  92.0  MCH 29.7  --  29.9  MCHC 32.3  --  32.5  RDW 13.5  --  13.6  PLT 275  --  220    BNP Recent Labs  Lab 08/25/20 1840  BNP 471.3*     DDimer No results for input(s): DDIMER in the last 168 hours.   Radiology    CT Head Wo Contrast  Result Date: 08/25/2020 CLINICAL DATA:  80 year old male with altered mental status, headache and possible seizure. EXAM: CT HEAD WITHOUT CONTRAST CT CERVICAL SPINE WITHOUT CONTRAST TECHNIQUE: Multidetector  CT imaging of the head and cervical spine was performed following the standard protocol without intravenous contrast. Multiplanar CT image reconstructions of the cervical spine were also generated. COMPARISON:  None. FINDINGS: CT HEAD FINDINGS Brain: A 1.8 x 1.2 x 2 cm partially calcified extra-axial posterior RIGHT posterior parietal mass is noted compatible with a meningioma. No adjacent parenchymal edema is noted. Atrophy and mild chronic small-vessel white matter ischemic changes are noted. There is no evidence of acute infarct, hemorrhage, midline shift or hydrocephalus. Vascular: Carotid and basilar atherosclerotic calcifications are noted. Skull: No acute or suspicious abnormalities. Sinuses/Orbits: Opacified RIGHT maxillary sinus noted. Other: None CT CERVICAL SPINE FINDINGS Alignment: Normal. Skull base and vertebrae: No acute fracture. No primary bone lesion or focal pathologic process. Soft tissues and spinal canal: No prevertebral fluid or swelling. No visible canal hematoma. Disc levels: Moderate degenerative disc disease/spondylosis at C5-6 and C6-7 noted. Facet arthropathy within the cervical spine also identified. Upper  chest: No acute abnormality Other: None IMPRESSION: 1. No evidence of acute intracranial abnormality. Atrophy and chronic small-vessel white matter ischemic changes. 2. 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 No adjacent parenchymal edema. 3. No static evidence of acute injury to the cervical spine. Electronically Signed   By: Margarette Canada M.D.   On: 08/25/2020 15:17   CT Cervical Spine Wo Contrast  Result Date: 08/25/2020 CLINICAL DATA:  80 year old male with altered mental status, headache and possible seizure. EXAM: CT HEAD WITHOUT CONTRAST CT CERVICAL SPINE WITHOUT CONTRAST TECHNIQUE: Multidetector CT imaging of the head and cervical spine was performed following the standard protocol without intravenous contrast. Multiplanar CT image reconstructions of the cervical spine were also generated. COMPARISON:  None. FINDINGS: CT HEAD FINDINGS Brain: A 1.8 x 1.2 x 2 cm partially calcified extra-axial posterior RIGHT posterior parietal mass is noted compatible with a meningioma. No adjacent parenchymal edema is noted. Atrophy and mild chronic small-vessel white matter ischemic changes are noted. There is no evidence of acute infarct, hemorrhage, midline shift or hydrocephalus. Vascular: Carotid and basilar atherosclerotic calcifications are noted. Skull: No acute or suspicious abnormalities. Sinuses/Orbits: Opacified RIGHT maxillary sinus noted. Other: None CT CERVICAL SPINE FINDINGS Alignment: Normal. Skull base and vertebrae: No acute fracture. No primary bone lesion or focal pathologic process. Soft tissues and spinal canal: No prevertebral fluid or swelling. No visible canal hematoma. Disc levels: Moderate degenerative disc disease/spondylosis at C5-6 and C6-7 noted. Facet arthropathy within the cervical spine also identified. Upper chest: No acute abnormality Other: None IMPRESSION: 1. No evidence of acute intracranial abnormality. Atrophy and chronic small-vessel white matter ischemic changes. 2.  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 No adjacent parenchymal edema. 3. No static evidence of acute injury to the cervical spine. Electronically Signed   By: Margarette Canada M.D.   On: 08/25/2020 15:17   MR Brain W and Wo Contrast  Result Date: 08/26/2020 CLINICAL DATA:  Seizure, nontraumatic. EXAM: MRI HEAD WITHOUT AND WITH CONTRAST TECHNIQUE: Multiplanar, multiecho pulse sequences of the brain and surrounding structures were obtained without and with intravenous contrast. CONTRAST:  43mL GADAVIST GADOBUTROL 1 MMOL/ML IV SOLN COMPARISON:  Head CT 08/25/2020. FINDINGS: Brain: Moderate generalized cerebral atrophy. Commensurate prominence of the ventricles and sulci. Comparatively mild cerebellar atrophy. 2.0 x 2.0 x 2.0 cm enhancing mass overlying the right parietal lobe, abutting the posterior aspect of the superior sagittal sinus, likely reflecting a meningioma (series 10, image 41) (series 11, image 5). Local mass effect upon the  a underlying right parietal lobe with mild underlying parenchymal edema (series 6, image 25). No ventricular effacement or midline shift. Mild multifocal T2/FLAIR hyperintensity within the cerebral white matter, nonspecific but compatible with chronic small vessel ischemic disease. T2/FLAIR sequences oriented to the perpendicular axis of the hippocampi were not acquired. There is no acute infarct. No chronic intracranial blood products. No extra-axial fluid collection. No midline shift. Partially empty sella turcica. Vascular: Expected proximal arterial flow voids. Non dominant intracranial left vertebral artery. Skull and upper cervical spine: Normal marrow signal. Sinuses/Orbits: Visualized orbits show no acute finding. Complete opacification of the right maxillary sinus. No more than mild paranasal sinus mucosal thickening elsewhere. Other: Small right mastoid effusion. IMPRESSION: 2.0 x 2.0 enhancing extra-axial mass overlying the right parietal lobe, most compatible  with meningioma. Local mass effect upon the underlying right parietal lobe with mild underlying parenchymal edema. The mass abuts the posterior aspect of the superior sagittal sinus. 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 Moderate generalized cerebral atrophy. Comparatively mild cerebellar atrophy. Paranasal sinus disease, most notably severe right maxillary sinusitis. Small right mastoid effusion. Electronically Signed   By: Kellie Simmering DO   On: 08/26/2020 08:30   DG Chest Portable 1 View  Result Date: 08/25/2020 CLINICAL DATA:  Altered mental status EXAM: PORTABLE CHEST 1 VIEW COMPARISON:  None. FINDINGS: Cardiomegaly and CABG changes noted. This is a low volume study. There is no evidence of focal airspace disease, pulmonary edema, suspicious pulmonary nodule/mass, pleural effusion, or pneumothorax. No acute bony abnormalities are identified. IMPRESSION: Cardiomegaly without evidence of acute cardiopulmonary disease. Electronically Signed   By: Margarette Canada M.D.   On: 08/25/2020 16:40    Cardiac Studies   Pending echo   Patient Profile     80 y.o. male  with a hx of CAD with CABG x5 in 2003, HTN, BPH, HLD, DM2 seen for atrial fibrillation rapid ventricular rate with left bundle blanch block in setting of witnessed seizure-like activity.  Assessment & Plan    Atrial fibrillation with rapid ventricular rate - Pt denies prior history of atrial fibrillation.  Converted to sinus rhythm.  Maintaining sinus rhythm with bradycardia in 40s. Will stop metoprolol.  He did not got any dose. -CHADSVASCs score of 4.  Recent mechanical fall.  Currently patient denies frequent falls.  Will review anticoagulation with MD given finding of brain mass.  2.  CAD s/p remote CABG  -Denies chest pain -Continue aspirin and statin  3.  Left bundle blanch block -Unknown chronicity  4.  Systolic murmur -Get echocardiogram  Patient recently moved from [place], [place].  He  receives his cardiac care at [facility]. Will request records.   For questions or updates, please contact [place] Please consult [email or web address] for contact info under      SignedLeanor Kail, PA  08/26/2020, 8:46 AM

## 2020-08-26 NOTE — ED Notes (Signed)
Echo at bedside

## 2020-08-26 NOTE — Progress Notes (Signed)
08/26/2020 Patient transfer from  the emergency room to [place] at 1700.  He is alert, oriented to person. Time place. Patient lower leg is dry, noted some scabs and discoloration. He was placed on a progressive monitor when arrive on the unit. [facility] RN.

## 2020-08-26 NOTE — Progress Notes (Signed)
Patient unavailable for EEG due to echo at bedside. Will try back as schedule permits

## 2020-08-26 NOTE — Procedures (Signed)
Patient Name: Erik Hines  MRN: [id number]  Epilepsy Attending: Lora Havens  Referring Physician/Provider: Dr Gareth Morgan Date: 08/26/2020 Duration: 22.39 mins  Patient history: 80 year old male with new onset seizures.  EEG to evaluate for seizures.  Level of alertness: Awake  AEDs during EEG study: None  Technical aspects: This EEG study was done with scalp electrodes positioned according to the 10-20 International system of electrode placement. Electrical activity was acquired at a sampling rate of 500Hz  and reviewed with a high frequency filter of 70Hz  and a low frequency filter of 1Hz . EEG data were recorded continuously and digitally stored.   Description: The posterior dominant rhythm consists of 8-9Hz  activity of moderate voltage (25-35 uV) seen predominantly in posterior head regions, symmetric and reactive to eye opening and eye closing.  EEG also showed continuous 2 to 3 Hz delta slowing in right temporoparietal region.  Hyperventilation and photic stimulation were not performed.     ABNORMALITY -Continuous slow, right temporoparietal region  IMPRESSION: This study is suggestive of cortical dysfunction in right temporoparietal region likely secondary to underlying structural abnormality/meningioma.  No seizures or definite epileptiform discharges were seen throughout the recording.   If suspicion for interictal activity remains a concern, a prolonged study can be considered.   [name] Barbra Sarks

## 2020-08-26 NOTE — Progress Notes (Signed)
EEG complete - results pending 

## 2020-08-26 NOTE — Progress Notes (Addendum)
Neurology Progress Note  Patient ID: Erik Hines is a 80 y.o. with PMHx significant for dementia, HTN, CAD, DM2, BPH, Glaucoma, HLD, hearing loss who was found by family with seizure like activity. EMS called and thought he was possibly in [place]. He was combative with EMS. Was given 5mg  of Versed. EKG per EMS with Afibb with RVR.  Initially consulted for: Concern for seizure versus syncope  Subjective: Reports he is feeling much better.  Amnestic to the events leading up to his hospitalization, but able to recount the history as told to him by his family. Denies headache  Exam: Vitals:   08/26/20 1230 08/26/20 1300  BP: (!) 153/65 (!) 158/67  Pulse: 67 67  Resp: 18 18  Temp:    SpO2: 100% 100%   Gen: In bed, comfortable  Resp: non-labored breathing, no grossly audible wheezing Cardiac: Perfusing extremities well  Abd: soft, nt  Neuro: MS: Awake, alert, oriented to month, year, day of the week, situation, reports that he is in [facility] but he is unfamiliar with the area having recently moved to [place] CN: Visual fields full to confrontation, EOMI, bilateral ptosis, left greater than right, mild left nasolabial fold flattening and slight droop on activation, sensation symmetric Motor: No pronator drift, some chronic contractures of bilateral fingers Sensory: Intact to light touch throughout Coordination: Finger-nose and heel-to-shin intact bilaterally DTR: 3+ patellar's and biceps bilaterally, symmetric  Pertinent Labs:  Basic Metabolic Panel: Recent Labs  Lab 08/25/20 1445 08/25/20 1511 08/26/20 0354  NA 135 137  136 137  K 3.5 3.6  3.6 3.5  CL 102 105 108  CO2 20*  --  21*  GLUCOSE 334* 339* 189*  BUN 19 20 16   CREATININE 1.70* 1.60* 1.39*  CALCIUM 9.4  --  8.7*  MG 1.6*  --  1.7    CBC: Recent Labs  Lab 08/25/20 1445 08/25/20 1511 08/26/20 0354  WBC 16.9*  --  8.9  NEUTROABS 15.4*  --   --   HGB 13.1 13.6  14.3 10.8*  HCT 40.6 40.0   42.0 33.2*  MCV 92.1  --  92.0  PLT 275  --  220   Lab Results  Component Value Date   VITAMINB12 79 (L) 08/25/2020      Lab Results  Component Value Date   CKTOTAL 187 08/25/2020     ECHO:  1. Left ventricular ejection fraction, by estimation, is 55 to 60%. The  left ventricle has normal function. The left ventricle has no regional  wall motion abnormalities. There is moderate asymmetric hypertrophy of the  basal septum. The rest of the LV  segments demonstrate mild left ventricular hypertrophy. Left ventricular  diastolic parameters are consistent with Grade II diastolic dysfunction  (pseudonormalization).   2. Right ventricular systolic function is low normal. The right  ventricular size is normal. There is normal pulmonary artery systolic  pressure.   3. Left atrial size was mildly dilated.   4. A small pericardial effusion is present. The pericardial effusion is  posterior to the left ventricle.   5. The mitral valve is degenerative. Mild mitral valve regurgitation.  Moderate mitral annular calcification.   6. The aortic valve is calcified. There is severe calcifcation of the  aortic valve. There is severe thickening of the aortic valve. Aortic valve  regurgitation is mild. 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 AVA  0.9cm2, mean gradient 39mmHg, peak  gradient 61mmHg, Vmax 2.5m/s, DI 0.23   7. The inferior  vena cava is normal in size with greater than 50%  respiratory variability, suggesting right atrial pressure of 3 mmHg.    EEG 7/11 Description: The posterior dominant rhythm consists of 8-9Hz  activity of moderate voltage (25-35 uV) seen predominantly in posterior head regions, symmetric and reactive to eye opening and eye closing.  EEG also showed continuous 2 to 3 Hz delta slowing in right temporoparietal region.  Hyperventilation and photic stimulation were not performed.    ABNORMALITY -Continuous slow, right temporoparietal region IMPRESSION: This study is  suggestive of cortical dysfunction in right temporoparietal region likely secondary to underlying structural abnormality/meningioma.  No seizures or definite epileptiform discharges were seen throughout the recording.    MRI brain personally reviewed, homogenously enhancing right parietal mass with dural tail consistent with meningioma with some mass-effect on the cortex, highly likely to be an epileptogenic focus  Impression: Given history and work-up detailed above, feel the patient most likely had a focal seizure with secondary generalization leading to his loss of consciousness followed by postictal agitation with gradual improvement in his mental status.  In this setting he also had new onset atrial fibrillation which I would favor managing with long-term anticoagulation to reduce stroke risk.  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 -Keppra 500 mg twice daily -Outpatient neurology follow-up in 1 to 2 months, referral placed to [facility] -Outpatient neurosurgery follow-up -Seizure precautions as detailed below, which should be included in discharge instructions  #Atrial fibrillation -Agree with anticoagulation, no neurological contraindication  #B12 deficiency -As this is a reversible cause of dementia, will start 1000 mcg daily x7 days followed by 1000 mcg orally indefinitely -PCP to follow-up repeat level, goal greater than 400  Standard seizure precautions: Per [facility] statutes, patients with seizures are not allowed to drive until  they have been seizure-free for six months. Use caution when using heavy equipment or power tools. Avoid working on ladders or at heights. Take showers instead of baths. Ensure the water temperature is not too high on the home water heater. Do not go swimming alone. When caring for infants or small children, sit down when holding, feeding, or  changing them to minimize risk of injury to the child in the event you have a seizure.  To reduce risk of seizures, maintain good sleep hygiene avoid alcohol and illicit drug use, take all anti-seizure medications as prescribed.   Neurology will be available on an as-needed basis going forward, please reach out if new questions arise  Lesleigh Noe MD-PhD [facility] [phone]   Greater than 35 minutes were spent in care of this patient today, greater than 50% of which is at bedside, reviewing seizure precautions, work-up, assessment and plan as detailed above.  All the patient's questions were answered and he demonstrated an excellent understanding of the basics of his hospitalization including that he has a benign brain tumor and new heart condition

## 2020-08-26 NOTE — ED Notes (Signed)
Patient transported to MRI 

## 2020-08-26 NOTE — Consult Note (Addendum)
NEUROLOGY CONSULTATION NOTE   Date of service: August 25, 2020 Patient Name: Erik Hines MRN:  [id number] DOB:  [birth year] Reason for consult: "Seizure" Requesting Provider: Orma Flaming, MD _ _ _   _ __   _ __ _ _  __ __   _ __   __ _  History of Present Illness  Erik Hines is a 80 y.o. male with PMH significant for dementia, HTN, CAD, DM2, BPH, Glaucoma, HLD, hearing loss who was found by family with seizure like activity. EMS called and thought he was possibly in [place]. He was combative with EMS. Was given 5mg  of Versed. EKG per EMS with Afibb with RVR.  He is somnolent on my evaluation and unable to provide any history. Most of the history obtained from son. Per discussion with son, he was combative in the ED and had to have the mitts placed on his hands.  Son witnessed the event. He found him on the ground with violent shaking of all extremities and eyes closed. The shaking went on for 15mins. Eyes were rolled up in his eyes wen he attempted to open them. He called EMS and the episode had subsided by the time EMS arrived. He had blood in his mouth and has passed urine. He was unresponsive when EMS initially saw him with sonorous respirations. He became combative when they attempted his vitals and glucose. His speech was very dysarthric like he had a think tongue and was very hard to understand him. Speech became more clear as they were trying to take him to the hospital.  He was given Versd by EMS. In the ED, he appears to have gotten an additional 2mg  of Versed and 1mg  of Ativan. He was given Keppra 1000mg  IV load.  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 No prior hx of CNS surgery, no prior CNS infection, no CNS surgery, hit his head last week with a facial bruise, unclear if he lost consciousness as he was by himself. No significant EtOH intake. Did not sleep well the night before. Nothing out of ordinary otherwise over the last few days.    ROS   Unable to obtain ROS  due to somnolence.  Past History  No past medical history on file.  No family history on file. Social History   Socioeconomic History  . Marital status: Married    Spouse name: Not on file  . Number of children: Not on file  . Years of education: Not on file  . Highest education level: Not on file  Occupational History  . Not on file  Tobacco Use  . Smoking status: Not on file  . Smokeless tobacco: Not on file  Substance and Sexual Activity  . Alcohol use: Not on file  . Drug use: Not on file  . Sexual activity: Not on file  Other Topics Concern  . Not on file  Social History Narrative  . Not on file   Social Determinants of Health   Financial Resource Strain: Not on file  Food Insecurity: Not on file  Transportation Needs: Not on file  Physical Activity: Not on file  Stress: Not on file  Social Connections: Not on file   No Known Allergies  Medications  (Not in a hospital admission)    Vitals   Vitals:   08/25/20 1815 08/25/20 1830 08/25/20 1845 08/25/20 1900  BP: 136/80 128/63 132/64 133/68  Pulse: (!) 107 93 84 87  Resp: (!) 25  20 (!) 23  Temp:      TempSrc:      SpO2: 96% 96% 95% 97%     There is no height or weight on file to calculate BMI.  Physical Exam   General: Laying comfortably in bed; in no acute distress. HENT: Normal oropharynx and mucosa. Normal external appearance of ears and nose. Neck: Supple, no pain or tenderness  CV: No JVD. No peripheral edema.  Pulmonary: Symmetric Chest rise. Normal respiratory effort.  Abdomen: Soft to touch, non-tender.  Ext: No cyanosis, edema, or deformity  Skin: No rash. Normal palpation of skin.  Musculoskeletal: Normal digits and nails by inspection. No clubbing.   Neurologic Examination  Mental status/Cognition: Somnolent, does not open eyes but moves purposefully on my exam and attempts to get up and out of the bed. Speech/language: does say "stop it" and no during my attempt at trying to wake  him up for evaluation Cranial nerves:   CN II Pupils equal and reactive to light, unable to assess for VF deficit.   CN III,IV,VI EOM intact to dolls eyes, no gaze preference or deviation, no nystagmus   CN V normal sensation in V1, V2, and V3 segments bilaterally   CN VII no asymmetry, no nasolabial fold flattening   CN VIII normal hearing to speech   CN IX & X    CN XI    CN XII midline tongue protrusion   Motor:  Muscle bulk: normal, tone normal, pronator drift: none  Unable to do detailed strength exam secondary to somnolence but   Reflexes:  Right Left Comments  Pectoralis      Biceps (C5/6)     Brachioradialis (C5/6)      Triceps (C6/7)      Patellar (L3/4)      Achilles (S1)      Hoffman      Plantar     Jaw jerk    Sensation:  Light touch    Pin prick    Temperature    Vibration   Proprioception    Coordination/Complex Motor:  Unable to do.  Labs   CBC:  Recent Labs  Lab 08/25/20 1445 08/25/20 1511  WBC 16.9*  --   NEUTROABS 15.4*  --   HGB 13.1 13.6  14.3  HCT 40.6 40.0  42.0  MCV 92.1  --   PLT 275  --     Basic Metabolic Panel:  Lab Results  Component Value Date   NA 136 08/25/2020   NA 137 08/25/2020   K 3.6 08/25/2020   K 3.6 08/25/2020   CO2 20 (L) 08/25/2020   GLUCOSE 339 (H) 08/25/2020   BUN 20 08/25/2020   CREATININE 1.60 (H) 08/25/2020   CALCIUM 9.4 08/25/2020   GFRNONAA 40 (L) 08/25/2020   Lipid Panel: No results found for: LDLCALC HgbA1c: No results found for: HGBA1C Urine Drug Screen:     Component Value Date/Time   LABOPIA NONE DETECTED 08/25/2020 1443   COCAINSCRNUR NONE DETECTED 08/25/2020 1443   LABBENZ POSITIVE (A) 08/25/2020 1443   AMPHETMU NONE DETECTED 08/25/2020 1443   THCU NONE DETECTED 08/25/2020 1443   LABBARB NONE DETECTED 08/25/2020 1443    Alcohol Level No results found for: Guadalupe Guerra  CT Head without contrast: 1. No evidence of acute intracranial abnormality. Atrophy and chronic small-vessel white  matter ischemic changes. 2. 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 No adjacent parenchymal edema. 3. No static evidence of acute injury to the cervical spine.  MRI  Brain: pending  rEEG:  pending  Impression   Erik Hines is a 80 y.o. male with PMH significant for dementia, HTN, CAD, DM2, BPH, Glaucoma, HLD, hearing loss who was found by family with what appears to be a prolonged seizure and post ictal confusion and agitation. He had to be put in restraint for agitation and had to be sedated for the same in the ED.  His exam is non focal but he is somnolent and attempts to get up during exam and take offf his mitss.  I do think that his event was potentially an epileptic seizures with his meningioma bein the seizure focus. Agree with getting MRI Brain with and without contrast and a rEEG. CT Head can be very insensitive for evaluating localized parenchymal edema from an impinging meningioma. No prior imaging to evaluate for change in the size of the meningioma.  Recommendations  - Agree with MRI Brain with and without contrast - agree with rEEG - Decision to start AEDs will be based on above. I would be more inclined to start AEDs, if the meningioma appears to be abutting or impinging on the brain parenchyma. - No driving for 6 months. - Evaluation of Afibb per Cardiology team. - Follow up with neurology outpatient. ______________________________________________________________________   Thank you for the opportunity to take part in the care of this patient. If you have any further questions, please contact the neurology consultation attending.  Signed,  [place] Pager Number [phone] _ _ _   _ __   _ __ _ _  __ __   _ __   __ _

## 2020-08-26 NOTE — ED Notes (Signed)
EEG at bedside.

## 2020-08-26 NOTE — ED Notes (Signed)
MRI notified this RN that pt was unable to stay still for MRI to be completed. Dr. Hal Hope notified

## 2020-08-27 ENCOUNTER — Other Ambulatory Visit (HOSPITAL_COMMUNITY): Payer: Self-pay

## 2020-08-27 DIAGNOSIS — I25810 Atherosclerosis of coronary artery bypass graft(s) without angina pectoris: Secondary | ICD-10-CM

## 2020-08-27 DIAGNOSIS — I350 Nonrheumatic aortic (valve) stenosis: Secondary | ICD-10-CM

## 2020-08-27 LAB — COMPREHENSIVE METABOLIC PANEL
ALT: 16 U/L (ref 0–44)
AST: 39 U/L (ref 15–41)
Albumin: 3.4 g/dL — ABNORMAL LOW (ref 3.5–5.0)
Alkaline Phosphatase: 61 U/L (ref 38–126)
Anion gap: 9 (ref 5–15)
BUN: 15 mg/dL (ref 8–23)
CO2: 23 mmol/L (ref 22–32)
Calcium: 9.4 mg/dL (ref 8.9–10.3)
Chloride: 106 mmol/L (ref 98–111)
Creatinine, Ser: 1.41 mg/dL — ABNORMAL HIGH (ref 0.61–1.24)
GFR, Estimated: 50 mL/min — ABNORMAL LOW (ref >60)
Glucose, Bld: 132 mg/dL — ABNORMAL HIGH (ref 70–99)
Potassium: 4.1 mmol/L (ref 3.5–5.1)
Sodium: 138 mmol/L (ref 135–145)
Total Bilirubin: 1.2 mg/dL (ref 0.3–1.2)
Total Protein: 6.2 g/dL — ABNORMAL LOW (ref 6.5–8.1)

## 2020-08-27 LAB — GLUCOSE, CAPILLARY
Glucose-Capillary: 114 mg/dL — ABNORMAL HIGH (ref 70–99)
Glucose-Capillary: 171 mg/dL — ABNORMAL HIGH (ref 70–99)

## 2020-08-27 MED ORDER — APIXABAN 5 MG PO TABS
5.0000 mg | ORAL_TABLET | Freq: Two times a day (BID) | ORAL | Status: DC
  Administered 2020-08-27: 5 mg via ORAL
  Filled 2020-08-27: qty 1

## 2020-08-27 MED ORDER — APIXABAN 5 MG PO TABS
5.0000 mg | ORAL_TABLET | Freq: Two times a day (BID) | ORAL | 1 refills | Status: DC
  Filled 2020-08-27: qty 60, 30d supply, fill #0

## 2020-08-27 MED ORDER — CYANOCOBALAMIN 1000 MCG PO TABS
1000.0000 ug | ORAL_TABLET | Freq: Every day | ORAL | 0 refills | Status: AC
  Filled 2020-08-27: qty 30, 30d supply, fill #0

## 2020-08-27 MED ORDER — LEVETIRACETAM 500 MG PO TABS
500.0000 mg | ORAL_TABLET | Freq: Two times a day (BID) | ORAL | 1 refills | Status: DC
  Filled 2020-08-27: qty 60, 30d supply, fill #0

## 2020-08-27 NOTE — TOC Transition Note (Addendum)
Transition of Care [facility]) - CM/SW Discharge Note   Patient Details  Name: Erik Hines MRN: [id number] Date of Birth: 06-30-1940  [facility] CM/SW Contact:  Angelita Ingles, RN Phone Number:[phone]  08/27/2020, 1:00 PM   Clinical Narrative:    TOC following for patient discharging home with [facility] services and DME. CM at bedside to offer choice. [facility] to provide [facility] services and [employer] to deliver DME to bedside. AVS has been updated. No there needs noted at this time. TOC will sign off.   Final next level of care: [employer] Barriers to Discharge: No Barriers Identified   Patient Goals and CMS Choice Patient states their goals for this hospitalization and ongoing recovery are:: Ready to go home CMS Medicare.gov Compare Post Acute Care list provided to:: Patient Choice offered to / list presented to : Patient  Discharge Placement                       Discharge Plan and Services                  DME Agency: [facility] Date DME Agency Contacted: 08/27/20 Time DME Agency Contacted: 68 Representative spoke with at DME Agency: [place]: PT [place]: [place] Date [place]: 08/27/20 Time [place]: 1300 Representative spoke with at [place]: Amy  Social Determinants of Health ([place]) Interventions     Readmission Risk Interventions No flowsheet data found.

## 2020-08-27 NOTE — Discharge Instructions (Signed)

## 2020-08-27 NOTE — Progress Notes (Signed)
ANTICOAGULATION CONSULT NOTE - Initial Consult  Pharmacy Consult for Apixaban Indication: atrial fibrillation  No Known Allergies  Patient Measurements: Height: 5\' 8"  (172.7 cm) Weight: 81.6 kg (180 lb) IBW/kg (Calculated) : 68.4   Vital Signs: Temp: 98.2 F (36.8 C) (07/12 0300) Temp Source: Oral (07/12 0300) BP: 133/78 (07/12 0300) Pulse Rate: 61 (07/12 0300)  Labs: Recent Labs    08/25/20 1445 08/25/20 1511 08/25/20 1655 08/25/20 1906 08/26/20 0354 08/27/20 0247  HGB 13.1 13.6  14.3  --   --  10.8*  --   HCT 40.6 40.0  42.0  --   --  33.2*  --   PLT 275  --   --   --  220  --   CREATININE 1.70* 1.60*  --   --  1.39* 1.41*  CKTOTAL  --   --   --  187  --   --   TROPONINIHS 48*  --  140*  --   --   --     Estimated Creatinine Clearance: 39.8 mL/min (A) (by C-G formula based on SCr of 1.41 mg/dL (H)).   Medical History: Past Medical History:  Diagnosis Date   LBBB (left bundle branch block) 08/25/2020    Assessment: 80 yo male with new onset atrial fibrillation. Pharmacy consulted to initiate apixaban dosing. Age > 80, but weight > 60 kg and Scr < 1.5; so dose will be apixaban 5 mg po bid.  Plan:  Start apixaban 5 mg po bid  Alanda Slim, PharmD, [facility] Clinical Pharmacist Please see AMION for all Pharmacists' Contact Phone Numbers 08/27/2020, 9:35 AM

## 2020-08-27 NOTE — Progress Notes (Signed)
Physical Therapy Evaluation Patient Details Name: Erik Hines MRN: [id number] DOB: 1941-01-01 Today's Date: 08/27/2020   History of Present Illness  80 y.o. male presenting to ED 7/10 with seizure-like activity. Patient admitted with acute metabolic encephalopathy and AKI. EEG suggestive of cortical dysfunction in right temporoparietal region likely secondary to underlying structural abnormality/meningioma. PMHx significant for CAD, CABG x5 in 2003, HTN, BPH, HLD and DMII.  Clinical Impression  Pt admitted with above diagnosis. Pt was able to ambulate in the hallway with min assist at times as he did lose his balance occasionally.  Discussed with pt recommendation of use of RW for balance at home and pt agrees.  Pt also with fluctuations in BP and HR.  See below.   Pt currently with functional limitations due to balance and endurance deficits. Pt will benefit from skilled PT to increase their independence and safety with mobility to allow discharge to the venue listed below.     Initial HR sitting in chair 60 bpm, 188/89 Standing 73 bpm, 178/66 Standing after 3 minutes 59 bpm, 186/60 HR during walk 116 bpm Once sitting in chair at end of walk 62 bpm, 196/76.  HR then noted to drop to 49 bpm and then to 56 bpm.  HR fluctuating throughout treatment and BP elevated significantly.    Follow Up Recommendations Home health PT    Equipment Recommendations  Rolling walker with 5" wheels    Recommendations for Other Services       Precautions / Restrictions Precautions Precautions: Fall Precaution Comments: Monitor HR Restrictions Weight Bearing Restrictions: No      Mobility  Bed Mobility Overal bed mobility: Independent                  Transfers Overall transfer level: Needs assistance Equipment used: None Transfers: Sit to/from Stand Sit to Stand: Supervision         General transfer comment: Supervision A for safety. Mildly increased time to  rise.  Ambulation/Gait Ambulation/Gait assistance: Min guard;Min assist Gait Distance (Feet): 350 Feet Assistive device: None Gait Pattern/deviations: Step-through pattern;Decreased stride length;Drifts right/left   Gait velocity interpretation: 1.31 - 2.62 ft/sec, indicative of limited community ambulator General Gait Details: Pt was able to ambulate without device with fair balance overall as pt did have a few episodes of LOB with challenges needing min assist to correct.  Pt also missing objects at times on his right as well questioning visual deficit. Pt states that his vision worsened years ago.  Stairs            Wheelchair Mobility    Modified Rankin (Stroke Patients Only) Modified Rankin (Stroke Patients Only) Pre-Morbid Rankin Score: No symptoms Modified Rankin: Moderately severe disability     Balance Overall balance assessment: Needs assistance         Standing balance support: No upper extremity supported;During functional activity Standing balance-Leahy Scale: Poor Standing balance comment: relies on min assist at times for dynamic activity, supervision static stance             High level balance activites: Direction changes;Turns;Sudden stops;Head turns High Level Balance Comments: min assist for balance with challenges             Pertinent Vitals/Pain Pain Assessment: No/denies pain    Home Living Family/patient expects to be discharged to:: Private residence Living Arrangements: Spouse/significant other;Children;Other (Comment) (Son (works during the day)) Available Help at Discharge: Family;Available PRN/intermittently Type of Home: House Home Access: Level entry;Other (comment) (Level entry from  garage)     Home Layout: Two level;Other (Comment) (Bedroom/bathroom on main level) Home Equipment: None      Prior Function Level of Independence: Independent         Comments: I with ADLs/IADLs without AE; does not drive     Hand  Dominance   Dominant Hand: Right    Extremity/Trunk Assessment   Upper Extremity Assessment Upper Extremity Assessment: Defer to OT evaluation    Lower Extremity Assessment Lower Extremity Assessment: Generalized weakness    Cervical / Trunk Assessment Cervical / Trunk Assessment: Kyphotic  Communication   Communication: No difficulties  Cognition Arousal/Alertness: Awake/alert Behavior During Therapy: WFL for tasks assessed/performed Overall Cognitive Status: History of cognitive impairments - at baseline                                 General Comments: STM deficits at baseline. Able to recall 2/3 on BIMs after 3 min.      General Comments General comments (skin integrity, edema, etc.): HR 125 max with grooming standing at sink level.    Exercises     Assessment/Plan    PT Assessment Patient needs continued PT services  PT Problem List Decreased activity tolerance;Decreased balance;Decreased mobility;Decreased knowledge of use of DME;Decreased safety awareness       PT Treatment Interventions DME instruction;Gait training;Functional mobility training;Therapeutic activities;Therapeutic exercise;Balance training;Patient/family education;Stair training    PT Goals (Current goals can be found in the Care Plan section)  Acute Rehab PT Goals Patient Stated Goal: To return home. PT Goal Formulation: With patient Time For Goal Achievement: 09/10/20 Potential to Achieve Goals: Good    Frequency Min 3X/week   Barriers to discharge        Co-evaluation               AM-PAC PT "6 Clicks" Mobility  Outcome Measure Help needed turning from your back to your side while in a flat bed without using bedrails?: A Little Help needed moving from lying on your back to sitting on the side of a flat bed without using bedrails?: A Little Help needed moving to and from a bed to a chair (including a wheelchair)?: A Little Help needed standing up from a chair  using your arms (e.g., wheelchair or bedside chair)?: A Little Help needed to walk in hospital room?: A Little Help needed climbing 3-5 steps with a railing? : A Little 6 Click Score: 18    End of Session Equipment Utilized During Treatment: Gait belt Activity Tolerance: Patient tolerated treatment well Patient left: in chair;with call bell/phone within reach;with chair alarm set Nurse Communication: Mobility status PT Visit Diagnosis: Unsteadiness on feet (R26.81);Muscle weakness (generalized) (M62.81)    Time: 5701-7793 PT Time Calculation (min) (ACUTE ONLY): 26 min   Charges:   PT Evaluation $PT Eval Moderate Complexity: 1 Mod PT Treatments $Gait Training: 8-22 mins        Juanda Luba M,PT Acute Rehab Services [phone] (pager)   Alvira Philips 08/27/2020, 10:54 AM

## 2020-08-27 NOTE — Evaluation (Signed)
Occupational Therapy Evaluation Patient Details Name: Erik Hines MRN: [id number] DOB: 10/26/1940 Today's Date: 08/27/2020    History of Present Illness 80 y.o. male presenting to ED 7/10 with seizure-like activity. Patient admitted with acute metabolic encephalopathy and AKI. EEG suggestive of cortical dysfunction in right temporoparietal region likely secondary to underlying structural abnormality/meningioma. PMHx significant for CAD, CABG x5 in 2003, HTN, BPH, HLD and DMII.   Clinical Impression   PTA patient was living with his spouse and son (works outside of the home during the day) and was grossly I with ADLs without AD. Patient no longer drives. 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 Both he and his wife moved from [place] to live with their son +3 weeks ago. Patient currently functioning near baseline demonstrating observed ADLs including grooming standing at sink level with supervision A. Patient also limited by deficits listed below including decreased dynamic standing balance and would benefit from continued acute OT services in prep for safe d/c home. Patient likely to progress well without need for follow-up OT post acute d/c. OT will continue to follow acutely.      Follow Up Recommendations  No OT follow up    Equipment Recommendations  None recommended by OT    Recommendations for Other Services       Precautions / Restrictions Precautions Precautions: Fall Precaution Comments: Monitor HR      Mobility Bed Mobility Overal bed mobility: Independent                  Transfers Overall transfer level: Needs assistance Equipment used: None Transfers: Sit to/from Stand Sit to Stand: Supervision         General transfer comment: Supervision A for safety. Mildly increased time to rise.    Balance Overall balance assessment: Mild deficits observed, not formally tested                                          ADL either performed or assessed with clinical judgement   ADL Overall ADL's : Needs assistance/impaired     Grooming: Supervision/safety;Standing Grooming Details (indicate cue type and reason): Supervision A for safety.             Lower Body Dressing: Supervision/safety;Sit to/from stand Lower Body Dressing Details (indicate cue type and reason): Able to doff/don footwear seated EOB without external assist. Toilet Transfer: Supervision/safety Toilet Transfer Details (indicate cue type and reason): Simulated with transfer to recliner.         Functional mobility during ADLs: Supervision/safety       Vision   Vision Assessment?: No apparent visual deficits     Perception     Praxis      Pertinent Vitals/Pain Pain Assessment: No/denies pain     Hand Dominance Right   Extremity/Trunk Assessment Upper Extremity Assessment Upper Extremity Assessment: Overall WFL for tasks assessed   Lower Extremity Assessment Lower Extremity Assessment: Defer to PT evaluation   Cervical / Trunk Assessment Cervical / Trunk Assessment: Kyphotic   Communication Communication Communication: No difficulties   Cognition Arousal/Alertness: Awake/alert Behavior During Therapy: WFL for tasks assessed/performed Overall Cognitive Status: History of cognitive impairments - at baseline  General Comments: STM deficits at baseline. Able to recall 2/3 on BIMs after 3 min.   General Comments  HR 125 max with grooming standing at sink level.    Exercises     Shoulder Instructions      Home Living Family/patient expects to be discharged to:: Private residence Living Arrangements: Spouse/significant other;Children;Other (Comment) (Son (works during the day)) Available Help at Discharge: Family;Available PRN/intermittently Type of Home: House Home Access: Level entry;Other (comment) (Level entry from garage)     Home Layout:  Two level;Other (Comment) (Bedroom/bathroom on main level)     Bathroom Shower/Tub: Teacher, early years/pre:  (Comfort height)     Home Equipment: None          Prior Functioning/Environment Level of Independence: Independent        Comments: I with ADLs/IADLs without AE; does not drive        OT Problem List: Impaired balance (sitting and/or standing);Decreased cognition      OT Treatment/Interventions: Self-care/ADL training;Therapeutic exercise;Therapeutic activities;Cognitive remediation/compensation;Patient/family education;Balance training    OT Goals(Current goals can be found in the care plan section) Acute Rehab OT Goals Patient Stated Goal: To return home. OT Goal Formulation: With patient Time For Goal Achievement: 09/10/20 Potential to Achieve Goals: Good ADL Goals Additional ADL Goal #1: Patient will complete all ADLs with Mod I in prep for safe return to PLOF. Additional ADL Goal #2: Patient will recall 3 strategies to reduce risk of falls in prep for safe return home.  OT Frequency: Min 2X/week   Barriers to D/C:            Co-evaluation              AM-PAC OT "6 Clicks" Daily Activity     Outcome Measure Help from another person eating meals?: None Help from another person taking care of personal grooming?: A Little Help from another person toileting, which includes using toliet, bedpan, or urinal?: A Little Help from another person bathing (including washing, rinsing, drying)?: A Little Help from another person to put on and taking off regular upper body clothing?: None Help from another person to put on and taking off regular lower body clothing?: A Little 6 Click Score: 20   End of Session Equipment Utilized During Treatment: Gait belt Nurse Communication: Mobility status;Other (comment) (Response to treatment)  Activity Tolerance: Patient tolerated treatment well Patient left: in chair;with call bell/phone within reach;with  chair alarm set  OT Visit Diagnosis: Unsteadiness on feet (R26.81)                Time: 5093-2671 OT Time Calculation (min): 21 min Charges:  OT General Charges $OT Visit: 1 Visit OT Evaluation $OT Eval Low Complexity: 1 Low Erik Hines H. OTR/L Supplemental OT, Department of rehab services [phone]  Erik Hines H. 08/27/2020, 9:14 AM

## 2020-08-27 NOTE — Social Work (Signed)
EDCSW received call in regards to DME (walker).  Per floor RN, Pt has been waiting for delivery of DME and family is here to take Pt home.  CSW called [employer] to clarify and spoke with Andree Coss who informed CSW that Pt's son had earlier declined walker and would be [facility].  CSW updated floor RN.  Vergie Living MSW LCSWA Transitions of Care  Clinical Social Worker  [facility] Emergency Departments  [phone]

## 2020-08-27 NOTE — Progress Notes (Addendum)
Progress Note  Patient Name: Erik Hines Date of Encounter: 08/27/2020  [facility] Cardiologist: None   Subjective   Feeling well.  Reports a known history of aortic stenosis. He was followed at [facility] for this.   Inpatient Medications    Scheduled Meds:  aspirin  81 mg Oral Daily   atorvastatin  40 mg Oral Daily   cyanocobalamin  1,000 mcg Intramuscular Daily   Followed by   Derrill Memo ON 09/03/2020] vitamin B-12  1,000 mcg Oral Daily   enoxaparin (LOVENOX) injection  40 mg Subcutaneous Q24H   finasteride  5 mg Oral Daily   insulin aspart  0-9 Units Subcutaneous TID WC   insulin detemir  9 Units Subcutaneous QHS   levETIRAcetam  500 mg Oral BID   pneumococcal 23 valent vaccine  0.5 mL Intramuscular Tomorrow-1000   tamsulosin  0.4 mg Oral Daily   Continuous Infusions:  PRN Meds: acetaminophen, ondansetron (ZOFRAN) IV   Vital Signs    Vitals:   08/26/20 1600 08/26/20 1900 08/27/20 0000 08/27/20 0300  BP: (!) 163/62 (!) 158/65 (!) 177/59 133/78  Pulse: 63 65 77 61  Resp: 17 16 18 17   Temp:  98 F (36.7 C)  98.2 F (36.8 C)  TempSrc:  Oral  Oral  SpO2: 100% 99% 98% 98%    Intake/Output Summary (Last 24 hours) at 08/27/2020 0826 Last data filed at 08/26/2020 0947 Gross per 24 hour  Intake 50 ml  Output --  Net 50 ml   No flowsheet data found.    Telemetry    Sinus rhythm, sinus bradycardia.  PVCs.  First degree heart block.  - Personally Reviewed  ECG    N/a - Personally Reviewed  Physical Exam   VS:  BP 133/78   Pulse 61   Temp 98.2 F (36.8 C) (Oral)   Resp 17   SpO2 98%  , BMI There is no height or weight on file to calculate BMI. GENERAL:  Well appearing HEENT: Pupils equal round and reactive, fundi not visualized, oral mucosa unremarkable NECK:  No jugular venous distention, waveform within normal limits, carotid upstroke brisk and symmetric, no bruits LUNGS:  Clear to auscultation bilaterally HEART:  RRR.  PMI not displaced or  sustained,S1 and S2 within normal limits, no S3, no S4, no clicks, no rubs, III/VI late-peaking systolic murmur at the LUSB ABD:  Flat, positive bowel sounds normal in frequency in pitch, no bruits, no rebound, no guarding, no midline pulsatile mass, no hepatomegaly, no splenomegaly EXT:  2 plus pulses throughout, no edema, no cyanosis no clubbing SKIN:  No rashes no nodules NEURO:  Cranial nerves II through XII grossly intact, motor grossly intact throughout PSYCH:  Cognitively intact, oriented to person place and time   Labs    High Sensitivity Troponin:   Recent Labs  Lab 08/25/20 1445 08/25/20 1655  TROPONINIHS 48* 140*      Chemistry Recent Labs  Lab 08/25/20 1445 08/25/20 1511 08/26/20 0354 08/27/20 0247  NA 135 137  136 137 138  K 3.5 3.6  3.6 3.5 4.1  CL 102 105 108 106  CO2 20*  --  21* 23  GLUCOSE 334* 339* 189* 132*  BUN 19 20 16 15   CREATININE 1.70* 1.60* 1.39* 1.41*  CALCIUM 9.4  --  8.7* 9.4  PROT 7.3  --  5.7* 6.2*  ALBUMIN 3.9  --  3.0* 3.4*  AST 30  --  26 39  ALT 20  --  17  16  ALKPHOS 70  --  57 61  BILITOT 1.0  --  1.0 1.2  GFRNONAA 40*  --  51* 50*  ANIONGAP 13  --  8 9     Hematology Recent Labs  Lab 08/25/20 1445 08/25/20 1511 08/26/20 0354  WBC 16.9*  --  8.9  RBC 4.41  --  3.61*  HGB 13.1 13.6  14.3 10.8*  HCT 40.6 40.0  42.0 33.2*  MCV 92.1  --  92.0  MCH 29.7  --  29.9  MCHC 32.3  --  32.5  RDW 13.5  --  13.6  PLT 275  --  220    BNP Recent Labs  Lab 08/25/20 1840  BNP 471.3*     DDimer No results for input(s): DDIMER in the last 168 hours.   Radiology    CT Head Wo Contrast  Result Date: 08/25/2020 CLINICAL DATA:  80 year old male with altered mental status, headache and possible seizure. EXAM: CT HEAD WITHOUT CONTRAST CT CERVICAL SPINE WITHOUT CONTRAST TECHNIQUE: Multidetector CT imaging of the head and cervical spine was performed following the standard protocol without intravenous contrast. Multiplanar CT  image reconstructions of the cervical spine were also generated. COMPARISON:  None. FINDINGS: CT HEAD FINDINGS Brain: A 1.8 x 1.2 x 2 cm partially calcified extra-axial posterior RIGHT posterior parietal mass is noted compatible with a meningioma. No adjacent parenchymal edema is noted. Atrophy and mild chronic small-vessel white matter ischemic changes are noted. There is no evidence of acute infarct, hemorrhage, midline shift or hydrocephalus. Vascular: Carotid and basilar atherosclerotic calcifications are noted. Skull: No acute or suspicious abnormalities. Sinuses/Orbits: Opacified RIGHT maxillary sinus noted. Other: None CT CERVICAL SPINE FINDINGS Alignment: Normal. Skull base and vertebrae: No acute fracture. No primary bone lesion or focal pathologic process. Soft tissues and spinal canal: No prevertebral fluid or swelling. No visible canal hematoma. Disc levels: Moderate degenerative disc disease/spondylosis at C5-6 and C6-7 noted. Facet arthropathy within the cervical spine also identified. Upper chest: No acute abnormality Other: None IMPRESSION: 1. No evidence of acute intracranial abnormality. Atrophy and chronic small-vessel white matter ischemic changes. 2. 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 No adjacent parenchymal edema. 3. No static evidence of acute injury to the cervical spine. Electronically Signed   By: Margarette Canada M.D.   On: 08/25/2020 15:17   CT Cervical Spine Wo Contrast  Result Date: 08/25/2020 CLINICAL DATA:  80 year old male with altered mental status, headache and possible seizure. EXAM: CT HEAD WITHOUT CONTRAST CT CERVICAL SPINE WITHOUT CONTRAST TECHNIQUE: Multidetector CT imaging of the head and cervical spine was performed following the standard protocol without intravenous contrast. Multiplanar CT image reconstructions of the cervical spine were also generated. COMPARISON:  None. FINDINGS: CT HEAD FINDINGS Brain: A 1.8 x 1.2 x 2 cm partially calcified extra-axial  posterior RIGHT posterior parietal mass is noted compatible with a meningioma. No adjacent parenchymal edema is noted. Atrophy and mild chronic small-vessel white matter ischemic changes are noted. There is no evidence of acute infarct, hemorrhage, midline shift or hydrocephalus. Vascular: Carotid and basilar atherosclerotic calcifications are noted. Skull: No acute or suspicious abnormalities. Sinuses/Orbits: Opacified RIGHT maxillary sinus noted. Other: None CT CERVICAL SPINE FINDINGS Alignment: Normal. Skull base and vertebrae: No acute fracture. No primary bone lesion or focal pathologic process. Soft tissues and spinal canal: No prevertebral fluid or swelling. No visible canal hematoma. Disc levels: Moderate degenerative disc disease/spondylosis at C5-6 and C6-7 noted. Facet arthropathy within the cervical  spine also identified. Upper chest: No acute abnormality Other: None IMPRESSION: 1. No evidence of acute intracranial abnormality. Atrophy and chronic small-vessel white matter ischemic changes. 2. 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 No adjacent parenchymal edema. 3. No static evidence of acute injury to the cervical spine. Electronically Signed   By: Margarette Canada M.D.   On: 08/25/2020 15:17   MR Brain W and Wo Contrast  Result Date: 08/26/2020 CLINICAL DATA:  Seizure, nontraumatic. EXAM: MRI HEAD WITHOUT AND WITH CONTRAST TECHNIQUE: Multiplanar, multiecho pulse sequences of the brain and surrounding structures were obtained without and with intravenous contrast. CONTRAST:  52mL GADAVIST GADOBUTROL 1 MMOL/ML IV SOLN COMPARISON:  Head CT 08/25/2020. FINDINGS: Brain: Moderate generalized cerebral atrophy. Commensurate prominence of the ventricles and sulci. Comparatively mild cerebellar atrophy. 2.0 x 2.0 x 2.0 cm enhancing mass overlying the right parietal lobe, abutting the posterior aspect of the superior sagittal sinus, likely reflecting a meningioma (series 10, image 41) (series 11,  image 5). Local mass effect upon the a underlying right parietal lobe with mild underlying parenchymal edema (series 6, image 25). No ventricular effacement or midline shift. Mild multifocal T2/FLAIR hyperintensity within the cerebral white matter, nonspecific but compatible with chronic small vessel ischemic disease. T2/FLAIR sequences oriented to the perpendicular axis of the hippocampi were not acquired. There is no acute infarct. No chronic intracranial blood products. No extra-axial fluid collection. No midline shift. Partially empty sella turcica. Vascular: Expected proximal arterial flow voids. Non dominant intracranial left vertebral artery. Skull and upper cervical spine: Normal marrow signal. Sinuses/Orbits: Visualized orbits show no acute finding. Complete opacification of the right maxillary sinus. No more than mild paranasal sinus mucosal thickening elsewhere. Other: Small right mastoid effusion. IMPRESSION: 2.0 x 2.0 enhancing extra-axial mass overlying the right parietal lobe, most compatible with meningioma. Local mass effect upon the underlying right parietal lobe with mild underlying parenchymal edema. The mass abuts the posterior aspect of the superior sagittal sinus. 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 Moderate generalized cerebral atrophy. Comparatively mild cerebellar atrophy. Paranasal sinus disease, most notably severe right maxillary sinusitis. Small right mastoid effusion. Electronically Signed   By: Kellie Simmering DO   On: 08/26/2020 08:30   DG Chest Portable 1 View  Result Date: 08/25/2020 CLINICAL DATA:  Altered mental status EXAM: PORTABLE CHEST 1 VIEW COMPARISON:  None. FINDINGS: Cardiomegaly and CABG changes noted. This is a low volume study. There is no evidence of focal airspace disease, pulmonary edema, suspicious pulmonary nodule/mass, pleural effusion, or pneumothorax. No acute bony abnormalities are identified. IMPRESSION: Cardiomegaly without  evidence of acute cardiopulmonary disease. Electronically Signed   By: Margarette Canada M.D.   On: 08/25/2020 16:40   EEG adult  Result Date: 08/26/2020 Lora Havens, MD     08/26/2020 12:37 PM Patient Name: Erik Hines MRN: [id number] Epilepsy Attending: Lora Havens Referring Physician/Provider: Dr Gareth Morgan Date: 08/26/2020 Duration: 22.39 mins Patient history: 80 year old male with new onset seizures.  EEG to evaluate for seizures. Level of alertness: Awake AEDs during EEG study: None Technical aspects: This EEG study was done with scalp electrodes positioned according to the 10-20 International system of electrode placement. Electrical activity was acquired at a sampling rate of 500Hz  and reviewed with a high frequency filter of 70Hz  and a low frequency filter of 1Hz . EEG data were recorded continuously and digitally stored. Description: The posterior dominant rhythm consists of 8-9Hz  activity of moderate voltage (25-35 uV) seen predominantly  in posterior head regions, symmetric and reactive to eye opening and eye closing.  EEG also showed continuous 2 to 3 Hz delta slowing in right temporoparietal region.  Hyperventilation and photic stimulation were not performed.   ABNORMALITY -Continuous slow, right temporoparietal region IMPRESSION: This study is suggestive of cortical dysfunction in right temporoparietal region likely secondary to underlying structural abnormality/meningioma.  No seizures or definite epileptiform discharges were seen throughout the recording.  If suspicion for interictal activity remains a concern, a prolonged study can be considered. Lora Havens   ECHOCARDIOGRAM COMPLETE  Result Date: 08/26/2020    ECHOCARDIOGRAM REPORT   Patient Name:   Erik Hines Date of Exam: 08/26/2020 Medical Rec #:  [id number]         Height: Accession #:    [id number]        Weight: Date of Birth:  Sep 01, 1940          BSA: Patient Age:    45 years          BP:           118/39 mmHg  Patient Gender: M                 HR:           43 bpm. Exam Location:  Inpatient Procedure: 2D Echo, Cardiac Doppler and Color Doppler Indications:    R55 Syncope  History:        Patient has no prior history of Echocardiogram examinations.                 Arrythmias:LBBB.  Sonographer:    Jonelle Sidle Dance Referring Phys: [id number] [place]  1. Left ventricular ejection fraction, by estimation, is 55 to 60%. The left ventricle has normal function. The left ventricle has no regional wall motion abnormalities. There is moderate asymmetric hypertrophy of the basal septum. The rest of the LV segments demonstrate mild left ventricular hypertrophy. Left ventricular diastolic parameters are consistent with Grade II diastolic dysfunction (pseudonormalization).  2. Right ventricular systolic function is low normal. The right ventricular size is normal. There is normal pulmonary artery systolic pressure.  3. Left atrial size was mildly dilated.  4. A small pericardial effusion is present. The pericardial effusion is posterior to the left ventricle.  5. The mitral valve is degenerative. Mild mitral valve regurgitation. Moderate mitral annular calcification.  6. The aortic valve is calcified. There is severe calcifcation of the aortic valve. There is severe thickening of the aortic valve. Aortic valve regurgitation is mild. 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 AVA 0.9cm2, mean gradient 38mmHg, peak gradient 64mmHg, Vmax 2.19m/s, DI 0.23  7. The inferior vena cava is normal in size with greater than 50% respiratory variability, suggesting right atrial pressure of 3 mmHg. Comparison(s): No prior Echocardiogram. FINDINGS  Left Ventricle: Left ventricular ejection fraction, by estimation, is 55 to 60%. The left ventricle has normal function. The left ventricle has no regional wall motion abnormalities. The left ventricular internal cavity size was normal in size. There is  moderate asymmetric hypertrophy of the  basal septum. The rest of the LV segments demonstrate mild left ventricular hypertrophy. Abnormal (paradoxical) septal motion, consistent with left bundle branch block. Left ventricular diastolic parameters are consistent with Grade II diastolic dysfunction (pseudonormalization). Right Ventricle: The right ventricular size is normal. No increase in right ventricular wall thickness. Right ventricular systolic function is low normal. There is normal pulmonary artery systolic pressure. The tricuspid regurgitant velocity is 2.74 m/s,  and with an assumed right atrial pressure of 3 mmHg, the estimated right ventricular systolic pressure is 25.3 mmHg. Left Atrium: Left atrial size was mildly dilated. Right Atrium: Right atrial size was normal in size. Pericardium: A small pericardial effusion is present. The pericardial effusion is posterior to the left ventricle. Mitral Valve: The mitral valve is degenerative in appearance. There is mild thickening of the mitral valve leaflet(s). There is moderate calcification of the mitral valve leaflet(s). Moderate mitral annular calcification. Mild mitral valve regurgitation. Tricuspid Valve: The tricuspid valve is normal in structure. Tricuspid valve regurgitation is trivial. Aortic Valve: AVA 0.9cm2, mean gradient 35mmHg, peak gradient 42mmHg, Vmax 2.34m/s, DI 0.23. The aortic valve is calcified. There is severe calcifcation of the aortic valve. There is severe thickening of the aortic valve. Aortic valve regurgitation is mild. Aortic regurgitation PHT measures 714 msec. Moderate to severe aortic stenosis is present. Aortic valve mean gradient measures 17.0 mmHg. Aortic valve peak gradient measures 28.9 mmHg. Aortic valve area, by VTI measures 0.92 cm. Pulmonic Valve: The pulmonic valve was normal in structure. Pulmonic valve regurgitation is trivial. Aorta: The aortic root is normal in size and structure. Venous: The inferior vena cava is normal in size with greater than 50%  respiratory variability, suggesting right atrial pressure of 3 mmHg. IAS/Shunts: No atrial level shunt detected by color flow Doppler.  LEFT VENTRICLE PLAX 2D LVIDd:         5.00 cm  Diastology LVIDs:         4.50 cm  LV e' medial:    3.81 cm/s LV PW:         1.10 cm  LV E/e' medial:  28.1 LV IVS:        1.20 cm  LV e' lateral:   11.40 cm/s LVOT diam:     2.10 cm  LV E/e' lateral: 9.4 LV SV:         62 LVOT Area:     3.46 cm  RIGHT VENTRICLE            IVC RV Basal diam:  2.70 cm    IVC diam: 1.80 cm RV S prime:     7.18 cm/s TAPSE (M-mode): 1.8 cm LEFT ATRIUM             RIGHT ATRIUM LA diam:        5.30 cm RA Area:     14.80 cm LA Vol (A2C):   55.5 ml RA Volume:   33.00 ml LA Vol (A4C):   59.6 ml LA Biplane Vol: 58.6 ml  AORTIC VALVE AV Area (Vmax):    0.89 cm AV Area (Vmean):   0.88 cm AV Area (VTI):     0.92 cm AV Vmax:           269.00 cm/s AV Vmean:          194.000 cm/s AV VTI:            0.670 m AV Peak Grad:      28.9 mmHg AV Mean Grad:      17.0 mmHg LVOT Vmax:         69.20 cm/s LVOT Vmean:        49.350 cm/s LVOT VTI:          0.178 m LVOT/AV VTI ratio: 0.27 AI PHT:            714 msec  AORTA Ao Root diam: 3.40 cm Ao Asc diam:  3.00 cm MITRAL VALVE  TRICUSPID VALVE MV Area (PHT): 3.42 cm     TR Peak grad:   30.0 mmHg MV Decel Time: 222 msec     TR Vmax:        274.00 cm/s MV E velocity: 107.00 cm/s MV A velocity: 70.70 cm/s   SHUNTS MV E/A ratio:  1.51         Systemic VTI:  0.18 m                             Systemic Diam: 2.10 cm Gwyndolyn Kaufman MD Electronically signed by Gwyndolyn Kaufman MD Signature Date/Time: 08/26/2020/3:23:58 PM    Final     Cardiac Studies   Echo 08/26/20:  1. Left ventricular ejection fraction, by estimation, is 55 to 60%. The  left ventricle has normal function. The left ventricle has no regional  wall motion abnormalities. There is moderate asymmetric hypertrophy of the  basal septum. The rest of the LV  segments demonstrate mild left ventricular  hypertrophy. Left ventricular  diastolic parameters are consistent with Grade II diastolic dysfunction  (pseudonormalization).   2. Right ventricular systolic function is low normal. The right  ventricular size is normal. There is normal pulmonary artery systolic  pressure.   3. Left atrial size was mildly dilated.   4. A small pericardial effusion is present. The pericardial effusion is  posterior to the left ventricle.   5. The mitral valve is degenerative. Mild mitral valve regurgitation.  Moderate mitral annular calcification.   6. The aortic valve is calcified. There is severe calcifcation of the  aortic valve. There is severe thickening of the aortic valve. Aortic valve  regurgitation is mild. 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 AVA  0.9cm2, mean gradient 65mmHg, peak  gradient 21mmHg, Vmax 2.50m/s, DI 0.23   7. The inferior vena cava is normal in size with greater than 50%  respiratory variability, suggesting right atrial pressure of 3 mmHg.   Patient Profile     80 y.o. male with CAD, dementia, hypertension, hyperlipidemia, diabetes, and hearing loss admitted with syncope vs seizure.  Cardiology consulted for VT vs atrial fibrillation with RVR.   Assessment & Plan   # Syncope vs. 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  Per neurology/neurosurgery.  He will have outpatient neurosurgery evaluation and has been started on antiepileptics.   # Atrial fibrillation:  New onset but not thought to be causative for his LOC.  Per neurology, OK to start anticoagulation.  Will hold aspirin and start Eliquis.  He is not on nodal agents due to bradycardia.  This is worse at night.  Consider outpatient sleep study.  # Moderate to severe aortic stenosis:  Patient reports a known history of aortic stenosis and was followed a [facility] prior to moving to [place].  Noted on echo 7/11.  Mean gradient 18 mmHg which is mild, but DI  0.23, consistent with severe aortic stenosis.  On my review of the echo there is minimal opening of the aortic valve and it likely is severe.  He will need outpatient evaluation for TAVR.    [facility] will sign off.   Medication Recommendations:  stop aspirin.  Start Eliquis.  Other recommendations (labs, testing, etc):  outpatient follow up for aortic stenosis Follow up as an outpatient:  we will arrange  For questions or updates, please contact [facility] Please consult [email or web address] for contact  info under        Signed, Skeet Latch, MD  08/27/2020, 8:26 AM

## 2020-08-27 NOTE — Discharge Summary (Signed)
PATIENT DETAILS Name: Erik Hines Age: 80 y.o. Sex: male Date of Birth: 03/31/1940 MRN: [id number]. Admitting Physician: Orma Flaming, MD PCP:Pcp, No  Admit Date: 08/25/2020 Discharge date: 08/27/2020  Recommendations for Outpatient Follow-up:  Follow up with PCP in 1-2 weeks Please obtain CMP/CBC in one week Please ensure outpatient follow-up with neurology, neurosurgery and cardiology  Admitted From:  Home  Disposition: [place]: No  Equipment/Devices: None  Discharge Condition: Stable  CODE STATUS: FULL CODE  Diet recommendation:  Diet Order             Diet - low sodium heart healthy           Diet Carb Modified           Diet heart healthy/carb modified Room service appropriate? Yes; Fluid consistency: Thin  Diet effective now                    Brief Summary: Patient is a 80 y.o. male with history of CAD s/p CABG in 2003, HTN, DM-2, BPH, HLD-who presented to the hospital with new onset seizures and A. fib with RVR.  See below for further details.     Significant events: 7/10>> admit for eval-new onset seizures-brief A. fib with RVR   Significant studies: 7/10>> CXR: No pneumonia 7/10>> CT head: No acute intracranial abnormality, right parietal meningioma. 7/10>> CT C-spine: No fracture/dislocation. 7/11>> MRI brain: 2.0 x 2.0 cm right parietal lobe meningioma with local mass-effect 7/11>> EEG: No seizures okay   Antimicrobial therapy: None   Microbiology data: 7/10>> urine culture: Pending 7/10>> blood culture: No growth 7/10>> COVID/influenza PCR: Negative   Procedures : None   Consults: Cardiology, neurology  Brief Hospital Course: New onset seizures: No further seizures since admission-work-up as above-evaluated by neurology-recommendations are to continue with Keppra on discharge.  Will need outpatient follow-up with neurology.  Patient/family aware of seizure precautions/discharge instructions-see  below.  Right temporoparietal lobe meningioma: Likely provoking seizures-we will need outpatient follow-up with neurosurgery.   Altered mental status due to postictal state: Resolved-doubt metabolic encephalopathy-suspect this was confusion from postictal state-he is completely awake and alert.   AKI-on? 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  PCP to follow.   Paroxysmal A. fib with RVR: Spontaneously converted back to sinus rhythm-evaluated by cardiology-recommendations are to proceed with Eliquis on discharge.  Moderate to severe aortic stenosis: Cardiology will arrange for outpatient TAVR evaluation.   Leukocytosis: Likely stress margination-resolved-no indication of infection.  Leukocytosis has resolved.   CAD s/p CABG: No anginal symptoms-continue statin-no longer on aspirin as patient will be started on Eliquis.   Type 2 diabetes mellitus(A1c 8.4 on 7/11): CBGs relatively stable-continue Levemir chess and SSI.  Resume metformin on discharge.  HTN: BP stable but slowly creeping up-since renal function has stabilized-resume usual dosing of lisinopril/HCTZ-and follow with PCP.   HLD: Continue statin   BPH: Continue Flomax and finasteride.  Vitamin B12 deficiency: Continue supplementation orally for now-have asked daughter to establish care with a local PCP (patient just moved into town) for IM/SQ supplementation.    Discharge Diagnoses:  Principal Problem:   Seizure-like activity ([place]) Active Problems:   HTN (hypertension)   HLD (hyperlipidemia)   Type 2 diabetes mellitus without complication (HCC)   BPH (benign prostatic hyperplasia)   CAD (coronary artery disease)   AKI (acute kidney injury) ([place])   Atrial fibrillation with RVR ([place])  Hypomagnesemia   Leukocytosis   Elevated troponin   Acute metabolic encephalopathy   LBBB (left bundle branch block)   Discharge  Instructions:  Activity:  As tolerated    Discharge Instructions     Ambulatory referral to Neurology   Complete by: As directed    An appointment is requested in approximately: 4-8 weeks for hospital follow-up for seizures, dementia   Call MD for:  extreme fatigue   Complete by: As directed    Call MD for:  persistant dizziness or light-headedness   Complete by: As directed    Diet - low sodium heart healthy   Complete by: As directed    Diet Carb Modified   Complete by: As directed    Discharge instructions   Complete by: As directed    Follow with Primary MD in 1-2 weeks  Follow with neurology/stroke clinic-they will call you with a follow-up appointment.  Follow-up with cardiology-they will call you with a follow-up appointment  Follow-up with neurosurgery-please call and make an appointment  Please get a complete blood count and chemistry panel checked by your Primary MD at your next visit, and again as instructed by your Primary MD.  Get Medicines reviewed and adjusted: Please take all your medications with you for your next visit with your Primary MD  Laboratory/radiological data: Please request your Primary MD to go over all hospital tests and procedure/radiological results at the follow up, please ask your Primary MD to get all Hospital records sent to his/her office.  In some cases, they will be blood work, cultures and biopsy results pending at the time of your discharge. Please request that your primary care M.D. follows up on these results.  Also Note the following: If you experience worsening of your admission symptoms, develop shortness of breath, life threatening emergency, suicidal or homicidal thoughts you must seek medical attention immediately by calling 911 or calling your MD immediately  if symptoms less severe.  You must read complete instructions/literature along with all the possible adverse reactions/side effects for all the Medicines you take and  that have been prescribed to you. Take any new Medicines after you have completely understood and accpet all the possible adverse reactions/side effects.   Do not drive when taking Pain medications or sleeping medications (Benzodaizepines)  Do not take more than prescribed Pain, Sleep and Anxiety Medications. It is not advisable to combine anxiety,sleep and pain medications without talking with your primary care practitioner  Special Instructions: If you have smoked or chewed Tobacco  in the last 2 yrs please stop smoking, stop any regular Alcohol  and or any Recreational drug use.  Wear Seat belts while driving.  Please note: You were cared for by a hospitalist during your hospital stay. Once you are discharged, your primary care physician will handle any further medical issues. Please note that NO REFILLS for any discharge medications will be authorized once you are discharged, as it is imperative that you return to your primary care physician (or establish a relationship with a primary care physician if you do not have one) for your post hospital discharge needs so that they can reassess your need for medications and monitor your lab values.   Seizure precautions: Per [facility] statutes, patients with seizures are not allowed to drive until they have been seizure-free for six months and cleared by a physician    Use caution when using heavy equipment or power tools. Avoid working on ladders or at heights. Take showers  instead of baths. Ensure the water temperature is not too high on the home water heater. Do not go swimming alone. Do not lock yourself in a room alone (i.e. bathroom). When caring for infants or small children, sit down when holding, feeding, or changing them to minimize risk of injury to the child in the event you have a seizure. Maintain good sleep hygiene. Avoid alcohol.    If patient has another seizure, call 911 and bring them back to the ED if: A.  The seizure lasts  longer than 5 minutes.      B.  The patient doesn't wake shortly after the seizure or has new problems such as difficulty seeing, speaking or moving following the seizure C.  The patient was injured during the seizure D.  The patient has a temperature over 102 F (39C) E.  The patient vomited during the seizure and now is having trouble breathing    During the Seizure   - First, ensure adequate ventilation and place patients on the floor on their left side  Loosen clothing around the neck and ensure the airway is patent. If the patient is clenching the teeth, do not force the mouth open with any object as this can cause severe damage - Remove all items from the surrounding that can be hazardous. The patient may be oblivious to what's happening and may not even know what he or she is doing. If the patient is confused and wandering, either gently guide him/her away and block access to outside areas - Reassure the individual and be comforting - Call 911. In most cases, the seizure ends before EMS arrives. However, there are cases when seizures may last over 3 to 5 minutes. Or the individual may have developed breathing difficulties or severe injuries. If a pregnant patient or a person with diabetes develops a seizure, it is prudent to call an ambulance. - Finally, if the patient does not regain full consciousness, then call EMS. Most patients will remain confused for about 45 to 90 minutes after a seizure, so you must use judgment in calling for help. - Avoid restraints but make sure the patient is in a bed with padded side rails - Place the individual in a lateral position with the neck slightly flexed; this will help the saliva drain from the mouth and prevent the tongue from falling backward - Remove all nearby furniture and other hazards from the area - Provide verbal assurance as the individual is regaining consciousness - Provide the patient with privacy if possible - Call for help and start  treatment as ordered by the caregiver    After the Seizure (Postictal Stage)   After a seizure, most patients experience confusion, fatigue, muscle pain and/or a headache. Thus, one should permit the individual to sleep. For the next few days, reassurance is essential. Being calm and helping reorient the person is also of importance.   Most seizures are painless and end spontaneously. Seizures are not harmful to others but can lead to complications such as stress on the lungs, brain and the heart. Individuals with prior lung problems may develop labored breathing and respiratory distress.   Increase activity slowly   Complete by: As directed       Allergies as of 08/27/2020   No Known Allergies      Medication List     STOP taking these medications    aspirin 81 MG EC tablet       TAKE these medications  apixaban 5 MG Tabs tablet Commonly known as: ELIQUIS Take 1 tablet (5 mg total) by mouth 2 (two) times daily.   atorvastatin 40 MG tablet Commonly known as: LIPITOR Take 40 mg by mouth daily.   cyanocobalamin 1000 MCG tablet Take 1 tablet (1,000 mcg total) by mouth daily. Start taking on: September 03, 2020   finasteride 5 MG tablet Commonly known as: PROSCAR Take 5 mg by mouth daily.   insulin detemir 100 UNIT/ML injection Commonly known as: LEVEMIR Inject 18 Units into the skin 2 (two) times daily.   levETIRAcetam 500 MG tablet Commonly known as: KEPPRA Take 1 tablet (500 mg total) by mouth 2 (two) times daily.   lisinopril-hydrochlorothiazide 10-12.5 MG tablet Commonly known as: ZESTORETIC Take 1 tablet by mouth daily.   metFORMIN 1000 MG tablet Commonly known as: GLUCOPHAGE Take 1,000 mg by mouth 2 (two) times daily.   tamsulosin 0.4 MG Caps capsule Commonly known as: FLOMAX Take 1 capsule by mouth daily.        Follow-up Information     Primary care practitioner. Schedule an appointment as soon as possible for a visit in 1 week(s).   Why:  Hospital follow up        [facility] Follow up.   Specialty: Neurology Why: Office will call with date/time, If you dont hear from them,please give them a call Contact information: [address] [place], [place] Follow up in 2 week(s).   Specialty: Neurosurgery Contact information: [place] [address] [phone]         [facility] Follow up.   Specialty: Cardiology Why: Office will call with date/time, If you dont hear from them,please give them a call Contact information: [address] [place] [address] [phone]               No Known Allergies    Other Procedures/Studies: CT Head Wo Contrast  Result Date: 08/25/2020 CLINICAL DATA:  80 year old male with altered mental status, headache and possible seizure. EXAM: CT HEAD WITHOUT CONTRAST CT CERVICAL SPINE WITHOUT CONTRAST TECHNIQUE: Multidetector CT imaging of the head and cervical spine was performed following the standard protocol without intravenous contrast. Multiplanar CT image reconstructions of the cervical spine were also generated. COMPARISON:  None. FINDINGS: CT HEAD FINDINGS Brain: A 1.8 x 1.2 x 2 cm partially calcified extra-axial posterior RIGHT posterior parietal mass is noted compatible with a meningioma. No adjacent parenchymal edema is noted. Atrophy and mild chronic small-vessel white matter ischemic changes are noted. There is no evidence of acute infarct, hemorrhage, midline shift or hydrocephalus. Vascular: Carotid and basilar atherosclerotic calcifications are noted. Skull: No acute or suspicious abnormalities. Sinuses/Orbits: Opacified RIGHT maxillary sinus noted. Other: None CT CERVICAL SPINE FINDINGS Alignment: Normal. Skull base and vertebrae: No acute fracture. No primary bone lesion or  focal pathologic process. Soft tissues and spinal canal: No prevertebral fluid or swelling. No visible canal hematoma. Disc levels: Moderate degenerative disc disease/spondylosis at C5-6 and C6-7 noted. Facet arthropathy within the cervical spine also identified. Upper chest: No acute abnormality Other: None IMPRESSION: 1. No evidence of acute intracranial abnormality. Atrophy and chronic small-vessel white matter ischemic changes. 2. 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 No adjacent parenchymal edema. 3. No static evidence of acute injury to the cervical spine. Electronically Signed  By: Margarette Canada M.D.   On: 08/25/2020 15:17   CT Cervical Spine Wo Contrast  Result Date: 08/25/2020 CLINICAL DATA:  80 year old male with altered mental status, headache and possible seizure. EXAM: CT HEAD WITHOUT CONTRAST CT CERVICAL SPINE WITHOUT CONTRAST TECHNIQUE: Multidetector CT imaging of the head and cervical spine was performed following the standard protocol without intravenous contrast. Multiplanar CT image reconstructions of the cervical spine were also generated. COMPARISON:  None. FINDINGS: CT HEAD FINDINGS Brain: A 1.8 x 1.2 x 2 cm partially calcified extra-axial posterior RIGHT posterior parietal mass is noted compatible with a meningioma. No adjacent parenchymal edema is noted. Atrophy and mild chronic small-vessel white matter ischemic changes are noted. There is no evidence of acute infarct, hemorrhage, midline shift or hydrocephalus. Vascular: Carotid and basilar atherosclerotic calcifications are noted. Skull: No acute or suspicious abnormalities. Sinuses/Orbits: Opacified RIGHT maxillary sinus noted. Other: None CT CERVICAL SPINE FINDINGS Alignment: Normal. Skull base and vertebrae: No acute fracture. No primary bone lesion or focal pathologic process. Soft tissues and spinal canal: No prevertebral fluid or swelling. No visible canal hematoma. Disc levels: Moderate degenerative disc  disease/spondylosis at C5-6 and C6-7 noted. Facet arthropathy within the cervical spine also identified. Upper chest: No acute abnormality Other: None IMPRESSION: 1. No evidence of acute intracranial abnormality. Atrophy and chronic small-vessel white matter ischemic changes. 2. 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 No adjacent parenchymal edema. 3. No static evidence of acute injury to the cervical spine. Electronically Signed   By: Margarette Canada M.D.   On: 08/25/2020 15:17   MR Brain W and Wo Contrast  Result Date: 08/26/2020 CLINICAL DATA:  Seizure, nontraumatic. EXAM: MRI HEAD WITHOUT AND WITH CONTRAST TECHNIQUE: Multiplanar, multiecho pulse sequences of the brain and surrounding structures were obtained without and with intravenous contrast. CONTRAST:  50mL GADAVIST GADOBUTROL 1 MMOL/ML IV SOLN COMPARISON:  Head CT 08/25/2020. FINDINGS: Brain: Moderate generalized cerebral atrophy. Commensurate prominence of the ventricles and sulci. Comparatively mild cerebellar atrophy. 2.0 x 2.0 x 2.0 cm enhancing mass overlying the right parietal lobe, abutting the posterior aspect of the superior sagittal sinus, likely reflecting a meningioma (series 10, image 41) (series 11, image 5). Local mass effect upon the a underlying right parietal lobe with mild underlying parenchymal edema (series 6, image 25). No ventricular effacement or midline shift. Mild multifocal T2/FLAIR hyperintensity within the cerebral white matter, nonspecific but compatible with chronic small vessel ischemic disease. T2/FLAIR sequences oriented to the perpendicular axis of the hippocampi were not acquired. There is no acute infarct. No chronic intracranial blood products. No extra-axial fluid collection. No midline shift. Partially empty sella turcica. Vascular: Expected proximal arterial flow voids. Non dominant intracranial left vertebral artery. Skull and upper cervical spine: Normal marrow signal. Sinuses/Orbits: Visualized  orbits show no acute finding. Complete opacification of the right maxillary sinus. No more than mild paranasal sinus mucosal thickening elsewhere. Other: Small right mastoid effusion. IMPRESSION: 2.0 x 2.0 enhancing extra-axial mass overlying the right parietal lobe, most compatible with meningioma. Local mass effect upon the underlying right parietal lobe with mild underlying parenchymal edema. The mass abuts the posterior aspect of the superior sagittal sinus. 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 Moderate generalized cerebral atrophy. Comparatively mild cerebellar atrophy. Paranasal sinus disease, most notably severe right maxillary sinusitis. Small right mastoid effusion. Electronically Signed   By: Kellie Simmering DO   On: 08/26/2020 08:30   DG Chest Portable 1 View  Result Date:  08/25/2020 CLINICAL DATA:  Altered mental status EXAM: PORTABLE CHEST 1 VIEW COMPARISON:  None. FINDINGS: Cardiomegaly and CABG changes noted. This is a low volume study. There is no evidence of focal airspace disease, pulmonary edema, suspicious pulmonary nodule/mass, pleural effusion, or pneumothorax. No acute bony abnormalities are identified. IMPRESSION: Cardiomegaly without evidence of acute cardiopulmonary disease. Electronically Signed   By: Margarette Canada M.D.   On: 08/25/2020 16:40   EEG adult  Result Date: 08/26/2020 Lora Havens, MD     08/26/2020 12:37 PM Patient Name: Erik Hines MRN: [id number] Epilepsy Attending: Lora Havens Referring Physician/Provider: Dr Gareth Morgan Date: 08/26/2020 Duration: 22.39 mins Patient history: 80 year old male with new onset seizures.  EEG to evaluate for seizures. Level of alertness: Awake AEDs during EEG study: None Technical aspects: This EEG study was done with scalp electrodes positioned according to the 10-20 International system of electrode placement. Electrical activity was acquired at a sampling rate of 500Hz  and reviewed with a high  frequency filter of 70Hz  and a low frequency filter of 1Hz . EEG data were recorded continuously and digitally stored. Description: The posterior dominant rhythm consists of 8-9Hz  activity of moderate voltage (25-35 uV) seen predominantly in posterior head regions, symmetric and reactive to eye opening and eye closing.  EEG also showed continuous 2 to 3 Hz delta slowing in right temporoparietal region.  Hyperventilation and photic stimulation were not performed.   ABNORMALITY -Continuous slow, right temporoparietal region IMPRESSION: This study is suggestive of cortical dysfunction in right temporoparietal region likely secondary to underlying structural abnormality/meningioma.  No seizures or definite epileptiform discharges were seen throughout the recording.  If suspicion for interictal activity remains a concern, a prolonged study can be considered. Lora Havens   ECHOCARDIOGRAM COMPLETE  Result Date: 08/26/2020    ECHOCARDIOGRAM REPORT   Patient Name:   LASARO PRIMM Date of Exam: 08/26/2020 Medical Rec #:  [id number]         Height: Accession #:    [id number]        Weight: Date of Birth:  1940-07-07          BSA: Patient Age:    80 years          BP:           118/39 mmHg Patient Gender: M                 HR:           43 bpm. Exam Location:  Inpatient Procedure: 2D Echo, Cardiac Doppler and Color Doppler Indications:    R55 Syncope  History:        Patient has no prior history of Echocardiogram examinations.                 Arrythmias:LBBB.  Sonographer:    Jonelle Sidle Dance Referring Phys: [id number] [place]  1. Left ventricular ejection fraction, by estimation, is 55 to 60%. The left ventricle has normal function. The left ventricle has no regional wall motion abnormalities. There is moderate asymmetric hypertrophy of the basal septum. The rest of the LV segments demonstrate mild left ventricular hypertrophy. Left ventricular diastolic parameters are consistent with Grade II diastolic  dysfunction (pseudonormalization).  2. Right ventricular systolic function is low normal. The right ventricular size is normal. There is normal pulmonary artery systolic pressure.  3. Left atrial size was mildly dilated.  4. A small pericardial effusion is present. The pericardial effusion is posterior to the left ventricle.  5. The mitral valve is degenerative. Mild mitral valve regurgitation. Moderate mitral annular calcification.  6. The aortic valve is calcified. There is severe calcifcation of the aortic valve. There is severe thickening of the aortic valve. Aortic valve regurgitation is mild. 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 AVA 0.9cm2, mean gradient 40mmHg, peak gradient 7mmHg, Vmax 2.52m/s, DI 0.23  7. The inferior vena cava is normal in size with greater than 50% respiratory variability, suggesting right atrial pressure of 3 mmHg. Comparison(s): No prior Echocardiogram. FINDINGS  Left Ventricle: Left ventricular ejection fraction, by estimation, is 55 to 60%. The left ventricle has normal function. The left ventricle has no regional wall motion abnormalities. The left ventricular internal cavity size was normal in size. There is  moderate asymmetric hypertrophy of the basal septum. The rest of the LV segments demonstrate mild left ventricular hypertrophy. Abnormal (paradoxical) septal motion, consistent with left bundle branch block. Left ventricular diastolic parameters are consistent with Grade II diastolic dysfunction (pseudonormalization). Right Ventricle: The right ventricular size is normal. No increase in right ventricular wall thickness. Right ventricular systolic function is low normal. There is normal pulmonary artery systolic pressure. The tricuspid regurgitant velocity is 2.74 m/s,  and with an assumed right atrial pressure of 3 mmHg, the estimated right ventricular systolic pressure is 37.6 mmHg. Left Atrium: Left atrial size was mildly dilated. Right Atrium: Right atrial size was  normal in size. Pericardium: A small pericardial effusion is present. The pericardial effusion is posterior to the left ventricle. Mitral Valve: The mitral valve is degenerative in appearance. There is mild thickening of the mitral valve leaflet(s). There is moderate calcification of the mitral valve leaflet(s). Moderate mitral annular calcification. Mild mitral valve regurgitation. Tricuspid Valve: The tricuspid valve is normal in structure. Tricuspid valve regurgitation is trivial. Aortic Valve: AVA 0.9cm2, mean gradient 73mmHg, peak gradient 44mmHg, Vmax 2.63m/s, DI 0.23. The aortic valve is calcified. There is severe calcifcation of the aortic valve. There is severe thickening of the aortic valve. Aortic valve regurgitation is mild. Aortic regurgitation PHT measures 714 msec. Moderate to severe aortic stenosis is present. Aortic valve mean gradient measures 17.0 mmHg. Aortic valve peak gradient measures 28.9 mmHg. Aortic valve area, by VTI measures 0.92 cm. Pulmonic Valve: The pulmonic valve was normal in structure. Pulmonic valve regurgitation is trivial. Aorta: The aortic root is normal in size and structure. Venous: The inferior vena cava is normal in size with greater than 50% respiratory variability, suggesting right atrial pressure of 3 mmHg. IAS/Shunts: No atrial level shunt detected by color flow Doppler.  LEFT VENTRICLE PLAX 2D LVIDd:         5.00 cm  Diastology LVIDs:         4.50 cm  LV e' medial:    3.81 cm/s LV PW:         1.10 cm  LV E/e' medial:  28.1 LV IVS:        1.20 cm  LV e' lateral:   11.40 cm/s LVOT diam:     2.10 cm  LV E/e' lateral: 9.4 LV SV:         62 LVOT Area:     3.46 cm  RIGHT VENTRICLE            IVC RV Basal diam:  2.70 cm    IVC diam: 1.80 cm RV S prime:     7.18 cm/s TAPSE (M-mode): 1.8 cm LEFT ATRIUM  RIGHT ATRIUM LA diam:        5.30 cm RA Area:     14.80 cm LA Vol (A2C):   55.5 ml RA Volume:   33.00 ml LA Vol (A4C):   59.6 ml LA Biplane Vol: 58.6 ml  AORTIC  VALVE AV Area (Vmax):    0.89 cm AV Area (Vmean):   0.88 cm AV Area (VTI):     0.92 cm AV Vmax:           269.00 cm/s AV Vmean:          194.000 cm/s AV VTI:            0.670 m AV Peak Grad:      28.9 mmHg AV Mean Grad:      17.0 mmHg LVOT Vmax:         69.20 cm/s LVOT Vmean:        49.350 cm/s LVOT VTI:          0.178 m LVOT/AV VTI ratio: 0.27 AI PHT:            714 msec  AORTA Ao Root diam: 3.40 cm Ao Asc diam:  3.00 cm MITRAL VALVE                TRICUSPID VALVE MV Area (PHT): 3.42 cm     TR Peak grad:   30.0 mmHg MV Decel Time: 222 msec     TR Vmax:        274.00 cm/s MV E velocity: 107.00 cm/s MV A velocity: 70.70 cm/s   SHUNTS MV E/A ratio:  1.51         Systemic VTI:  0.18 m                             Systemic Diam: 2.10 cm Gwyndolyn Kaufman MD Electronically signed by Gwyndolyn Kaufman MD Signature Date/Time: 08/26/2020/3:23:58 PM    Final      TODAY-DAY OF DISCHARGE:  Subjective:   Erik Hines today has no headache,no chest abdominal pain,no new weakness tingling or numbness, feels much better wants to go home today.   Objective:   Blood pressure (!) 170/70, pulse 71, temperature 98.4 F (36.9 C), temperature source Oral, resp. rate 16, height 5\' 8"  (1.727 m), weight 81.6 kg, SpO2 98 %. No intake or output data in the 24 hours ending 08/27/20 1037 Filed Weights   08/27/20 0900  Weight: 81.6 kg    Exam: Awake Alert, Oriented *3, No new F.N deficits, Normal affect [place].AT,PERRAL Supple Neck,No JVD, No cervical lymphadenopathy appriciated.  Symmetrical Chest wall movement, Good air movement bilaterally, CTAB RRR,No Gallops,Rubs or new Murmurs, No Parasternal Heave +ve B.Sounds, Abd Soft, Non tender, No organomegaly appriciated, No rebound -guarding or rigidity. No Cyanosis, Clubbing or edema, No new Rash or bruise   PERTINENT RADIOLOGIC STUDIES: CT Head Wo Contrast  Result Date: 08/25/2020 CLINICAL DATA:  80 year old male with altered mental status, headache and possible  seizure. EXAM: CT HEAD WITHOUT CONTRAST CT CERVICAL SPINE WITHOUT CONTRAST TECHNIQUE: Multidetector CT imaging of the head and cervical spine was performed following the standard protocol without intravenous contrast. Multiplanar CT image reconstructions of the cervical spine were also generated. COMPARISON:  None. FINDINGS: CT HEAD FINDINGS Brain: A 1.8 x 1.2 x 2 cm partially calcified extra-axial posterior RIGHT posterior parietal mass is noted compatible with a meningioma. No adjacent parenchymal edema is noted. Atrophy and mild chronic small-vessel white  matter ischemic changes are noted. There is no evidence of acute infarct, hemorrhage, midline shift or hydrocephalus. Vascular: Carotid and basilar atherosclerotic calcifications are noted. Skull: No acute or suspicious abnormalities. Sinuses/Orbits: Opacified RIGHT maxillary sinus noted. Other: None CT CERVICAL SPINE FINDINGS Alignment: Normal. Skull base and vertebrae: No acute fracture. No primary bone lesion or focal pathologic process. Soft tissues and spinal canal: No prevertebral fluid or swelling. No visible canal hematoma. Disc levels: Moderate degenerative disc disease/spondylosis at C5-6 and C6-7 noted. Facet arthropathy within the cervical spine also identified. Upper chest: No acute abnormality Other: None IMPRESSION: 1. No evidence of acute intracranial abnormality. Atrophy and chronic small-vessel white matter ischemic changes. 2. 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 No adjacent parenchymal edema. 3. No static evidence of acute injury to the cervical spine. Electronically Signed   By: Margarette Canada M.D.   On: 08/25/2020 15:17   CT Cervical Spine Wo Contrast  Result Date: 08/25/2020 CLINICAL DATA:  80 year old male with altered mental status, headache and possible seizure. EXAM: CT HEAD WITHOUT CONTRAST CT CERVICAL SPINE WITHOUT CONTRAST TECHNIQUE: Multidetector CT imaging of the head and cervical spine was performed following  the standard protocol without intravenous contrast. Multiplanar CT image reconstructions of the cervical spine were also generated. COMPARISON:  None. FINDINGS: CT HEAD FINDINGS Brain: A 1.8 x 1.2 x 2 cm partially calcified extra-axial posterior RIGHT posterior parietal mass is noted compatible with a meningioma. No adjacent parenchymal edema is noted. Atrophy and mild chronic small-vessel white matter ischemic changes are noted. There is no evidence of acute infarct, hemorrhage, midline shift or hydrocephalus. Vascular: Carotid and basilar atherosclerotic calcifications are noted. Skull: No acute or suspicious abnormalities. Sinuses/Orbits: Opacified RIGHT maxillary sinus noted. Other: None CT CERVICAL SPINE FINDINGS Alignment: Normal. Skull base and vertebrae: No acute fracture. No primary bone lesion or focal pathologic process. Soft tissues and spinal canal: No prevertebral fluid or swelling. No visible canal hematoma. Disc levels: Moderate degenerative disc disease/spondylosis at C5-6 and C6-7 noted. Facet arthropathy within the cervical spine also identified. Upper chest: No acute abnormality Other: None IMPRESSION: 1. No evidence of acute intracranial abnormality. Atrophy and chronic small-vessel white matter ischemic changes. 2. 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 No adjacent parenchymal edema. 3. No static evidence of acute injury to the cervical spine. Electronically Signed   By: Margarette Canada M.D.   On: 08/25/2020 15:17   MR Brain W and Wo Contrast  Result Date: 08/26/2020 CLINICAL DATA:  Seizure, nontraumatic. EXAM: MRI HEAD WITHOUT AND WITH CONTRAST TECHNIQUE: Multiplanar, multiecho pulse sequences of the brain and surrounding structures were obtained without and with intravenous contrast. CONTRAST:  40mL GADAVIST GADOBUTROL 1 MMOL/ML IV SOLN COMPARISON:  Head CT 08/25/2020. FINDINGS: Brain: Moderate generalized cerebral atrophy. Commensurate prominence of the ventricles and sulci.  Comparatively mild cerebellar atrophy. 2.0 x 2.0 x 2.0 cm enhancing mass overlying the right parietal lobe, abutting the posterior aspect of the superior sagittal sinus, likely reflecting a meningioma (series 10, image 41) (series 11, image 5). Local mass effect upon the a underlying right parietal lobe with mild underlying parenchymal edema (series 6, image 25). No ventricular effacement or midline shift. Mild multifocal T2/FLAIR hyperintensity within the cerebral white matter, nonspecific but compatible with chronic small vessel ischemic disease. T2/FLAIR sequences oriented to the perpendicular axis of the hippocampi were not acquired. There is no acute infarct. No chronic intracranial blood products. No extra-axial fluid collection. No midline shift. Partially  empty sella turcica. Vascular: Expected proximal arterial flow voids. Non dominant intracranial left vertebral artery. Skull and upper cervical spine: Normal marrow signal. Sinuses/Orbits: Visualized orbits show no acute finding. Complete opacification of the right maxillary sinus. No more than mild paranasal sinus mucosal thickening elsewhere. Other: Small right mastoid effusion. IMPRESSION: 2.0 x 2.0 enhancing extra-axial mass overlying the right parietal lobe, most compatible with meningioma. Local mass effect upon the underlying right parietal lobe with mild underlying parenchymal edema. The mass abuts the posterior aspect of the superior sagittal sinus. 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 Moderate generalized cerebral atrophy. Comparatively mild cerebellar atrophy. Paranasal sinus disease, most notably severe right maxillary sinusitis. Small right mastoid effusion. Electronically Signed   By: Kellie Simmering DO   On: 08/26/2020 08:30   DG Chest Portable 1 View  Result Date: 08/25/2020 CLINICAL DATA:  Altered mental status EXAM: PORTABLE CHEST 1 VIEW COMPARISON:  None. FINDINGS: Cardiomegaly and CABG changes noted. This is  a low volume study. There is no evidence of focal airspace disease, pulmonary edema, suspicious pulmonary nodule/mass, pleural effusion, or pneumothorax. No acute bony abnormalities are identified. IMPRESSION: Cardiomegaly without evidence of acute cardiopulmonary disease. Electronically Signed   By: Margarette Canada M.D.   On: 08/25/2020 16:40   EEG adult  Result Date: 08/26/2020 Lora Havens, MD     08/26/2020 12:37 PM Patient Name: Erik Hines MRN: [id number] Epilepsy Attending: Lora Havens Referring Physician/Provider: Dr Gareth Morgan Date: 08/26/2020 Duration: 22.39 mins Patient history: 80 year old male with new onset seizures.  EEG to evaluate for seizures. Level of alertness: Awake AEDs during EEG study: None Technical aspects: This EEG study was done with scalp electrodes positioned according to the 10-20 International system of electrode placement. Electrical activity was acquired at a sampling rate of 500Hz  and reviewed with a high frequency filter of 70Hz  and a low frequency filter of 1Hz . EEG data were recorded continuously and digitally stored. Description: The posterior dominant rhythm consists of 8-9Hz  activity of moderate voltage (25-35 uV) seen predominantly in posterior head regions, symmetric and reactive to eye opening and eye closing.  EEG also showed continuous 2 to 3 Hz delta slowing in right temporoparietal region.  Hyperventilation and photic stimulation were not performed.   ABNORMALITY -Continuous slow, right temporoparietal region IMPRESSION: This study is suggestive of cortical dysfunction in right temporoparietal region likely secondary to underlying structural abnormality/meningioma.  No seizures or definite epileptiform discharges were seen throughout the recording.  If suspicion for interictal activity remains a concern, a prolonged study can be considered. Lora Havens   ECHOCARDIOGRAM COMPLETE  Result Date: 08/26/2020    ECHOCARDIOGRAM REPORT   Patient Name:    Erik Hines Date of Exam: 08/26/2020 Medical Rec #:  [id number]         Height: Accession #:    [id number]        Weight: Date of Birth:  [birth year]          BSA: Patient Age:    28 years          BP:           118/39 mmHg Patient Gender: M                 HR:           43 bpm. Exam Location:  Inpatient Procedure: 2D Echo, Cardiac Doppler and Color Doppler Indications:    R55 Syncope  History:  Patient has no prior history of Echocardiogram examinations.                 Arrythmias:LBBB.  Sonographer:    Jonelle Sidle Dance Referring Phys: [id number] [place]  1. Left ventricular ejection fraction, by estimation, is 55 to 60%. The left ventricle has normal function. The left ventricle has no regional wall motion abnormalities. There is moderate asymmetric hypertrophy of the basal septum. The rest of the LV segments demonstrate mild left ventricular hypertrophy. Left ventricular diastolic parameters are consistent with Grade II diastolic dysfunction (pseudonormalization).  2. Right ventricular systolic function is low normal. The right ventricular size is normal. There is normal pulmonary artery systolic pressure.  3. Left atrial size was mildly dilated.  4. A small pericardial effusion is present. The pericardial effusion is posterior to the left ventricle.  5. The mitral valve is degenerative. Mild mitral valve regurgitation. Moderate mitral annular calcification.  6. The aortic valve is calcified. There is severe calcifcation of the aortic valve. There is severe thickening of the aortic valve. Aortic valve regurgitation is mild. 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 AVA 0.9cm2, mean gradient 82mmHg, peak gradient 79mmHg, Vmax 2.42m/s, DI 0.23  7. The inferior vena cava is normal in size with greater than 50% respiratory variability, suggesting right atrial pressure of 3 mmHg. Comparison(s): No prior Echocardiogram. FINDINGS  Left Ventricle: Left ventricular ejection fraction, by estimation, is  55 to 60%. The left ventricle has normal function. The left ventricle has no regional wall motion abnormalities. The left ventricular internal cavity size was normal in size. There is  moderate asymmetric hypertrophy of the basal septum. The rest of the LV segments demonstrate mild left ventricular hypertrophy. Abnormal (paradoxical) septal motion, consistent with left bundle branch block. Left ventricular diastolic parameters are consistent with Grade II diastolic dysfunction (pseudonormalization). Right Ventricle: The right ventricular size is normal. No increase in right ventricular wall thickness. Right ventricular systolic function is low normal. There is normal pulmonary artery systolic pressure. The tricuspid regurgitant velocity is 2.74 m/s,  and with an assumed right atrial pressure of 3 mmHg, the estimated right ventricular systolic pressure is 40.0 mmHg. Left Atrium: Left atrial size was mildly dilated. Right Atrium: Right atrial size was normal in size. Pericardium: A small pericardial effusion is present. The pericardial effusion is posterior to the left ventricle. Mitral Valve: The mitral valve is degenerative in appearance. There is mild thickening of the mitral valve leaflet(s). There is moderate calcification of the mitral valve leaflet(s). Moderate mitral annular calcification. Mild mitral valve regurgitation. Tricuspid Valve: The tricuspid valve is normal in structure. Tricuspid valve regurgitation is trivial. Aortic Valve: AVA 0.9cm2, mean gradient 41mmHg, peak gradient 19mmHg, Vmax 2.60m/s, DI 0.23. The aortic valve is calcified. There is severe calcifcation of the aortic valve. There is severe thickening of the aortic valve. Aortic valve regurgitation is mild. Aortic regurgitation PHT measures 714 msec. Moderate to severe aortic stenosis is present. Aortic valve mean gradient measures 17.0 mmHg. Aortic valve peak gradient measures 28.9 mmHg. Aortic valve area, by VTI measures 0.92 cm. Pulmonic  Valve: The pulmonic valve was normal in structure. Pulmonic valve regurgitation is trivial. Aorta: The aortic root is normal in size and structure. Venous: The inferior vena cava is normal in size with greater than 50% respiratory variability, suggesting right atrial pressure of 3 mmHg. IAS/Shunts: No atrial level shunt detected by color flow Doppler.  LEFT VENTRICLE PLAX 2D LVIDd:         5.00  cm  Diastology LVIDs:         4.50 cm  LV e' medial:    3.81 cm/s LV PW:         1.10 cm  LV E/e' medial:  28.1 LV IVS:        1.20 cm  LV e' lateral:   11.40 cm/s LVOT diam:     2.10 cm  LV E/e' lateral: 9.4 LV SV:         62 LVOT Area:     3.46 cm  RIGHT VENTRICLE            IVC RV Basal diam:  2.70 cm    IVC diam: 1.80 cm RV S prime:     7.18 cm/s TAPSE (M-mode): 1.8 cm LEFT ATRIUM             RIGHT ATRIUM LA diam:        5.30 cm RA Area:     14.80 cm LA Vol (A2C):   55.5 ml RA Volume:   33.00 ml LA Vol (A4C):   59.6 ml LA Biplane Vol: 58.6 ml  AORTIC VALVE AV Area (Vmax):    0.89 cm AV Area (Vmean):   0.88 cm AV Area (VTI):     0.92 cm AV Vmax:           269.00 cm/s AV Vmean:          194.000 cm/s AV VTI:            0.670 m AV Peak Grad:      28.9 mmHg AV Mean Grad:      17.0 mmHg LVOT Vmax:         69.20 cm/s LVOT Vmean:        49.350 cm/s LVOT VTI:          0.178 m LVOT/AV VTI ratio: 0.27 AI PHT:            714 msec  AORTA Ao Root diam: 3.40 cm Ao Asc diam:  3.00 cm MITRAL VALVE                TRICUSPID VALVE MV Area (PHT): 3.42 cm     TR Peak grad:   30.0 mmHg MV Decel Time: 222 msec     TR Vmax:        274.00 cm/s MV E velocity: 107.00 cm/s MV A velocity: 70.70 cm/s   SHUNTS MV E/A ratio:  1.51         Systemic VTI:  0.18 m                             Systemic Diam: 2.10 cm Gwyndolyn Kaufman MD Electronically signed by Gwyndolyn Kaufman MD Signature Date/Time: 08/26/2020/3:23:58 PM    Final      PERTINENT LAB RESULTS: CBC: Recent Labs    08/25/20 1445 08/25/20 1511 08/26/20 0354  WBC 16.9*  --  8.9   HGB 13.1 13.6  14.3 10.8*  HCT 40.6 40.0  42.0 33.2*  PLT 275  --  220   CMET CMP     Component Value Date/Time   NA 138 08/27/2020 0247   K 4.1 08/27/2020 0247   CL 106 08/27/2020 0247   CO2 23 08/27/2020 0247   GLUCOSE 132 (H) 08/27/2020 0247   BUN 15 08/27/2020 0247   CREATININE 1.41 (H) 08/27/2020 0247   CALCIUM 9.4 08/27/2020 0247   PROT 6.2 (L)  08/27/2020 0247   ALBUMIN 3.4 (L) 08/27/2020 0247   AST 39 08/27/2020 0247   ALT 16 08/27/2020 0247   ALKPHOS 61 08/27/2020 0247   BILITOT 1.2 08/27/2020 0247   GFRNONAA 50 (L) 08/27/2020 0247    GFR Estimated Creatinine Clearance: 39.8 mL/min (A) (by C-G formula based on SCr of 1.41 mg/dL (H)). No results for input(s): LIPASE, AMYLASE in the last 72 hours. Recent Labs    08/25/20 1906  CKTOTAL 187   Invalid input(s): POCBNP No results for input(s): DDIMER in the last 72 hours. Recent Labs    08/26/20 0354  HGBA1C 8.4*   No results for input(s): CHOL, HDL, LDLCALC, TRIG, CHOLHDL, LDLDIRECT in the last 72 hours. Recent Labs    08/25/20 1905  TSH 1.680   Recent Labs    08/25/20 1906  VITAMINB12 79*   Coags: No results for input(s): INR in the last 72 hours.  Invalid input(s): PT Microbiology: Recent Results (from the past 240 hour(s))  Resp Panel by RT-PCR (Flu A&B, Covid) Nasopharyngeal Swab     Status: None   Collection Time: 08/25/20  2:41 PM   Specimen: Nasopharyngeal Swab; Nasopharyngeal(NP) swabs in vial transport medium  Result Value Ref Range Status   SARS Coronavirus 2 by RT PCR NEGATIVE NEGATIVE Final    Comment: (NOTE) SARS-CoV-2 target nucleic acids are NOT DETECTED.  The SARS-CoV-2 RNA is generally detectable in upper respiratory specimens during the acute phase of infection. The lowest concentration of SARS-CoV-2 viral copies this assay can detect is 138 copies/mL. A negative result does not preclude SARS-Cov-2 infection and should not be used as the sole basis for treatment or other  patient management decisions. A negative result may occur with  improper specimen collection/handling, submission of specimen other than nasopharyngeal swab, presence of viral mutation(s) within the areas targeted by this assay, and inadequate number of viral copies(<138 copies/mL). A negative result must be combined with clinical observations, patient history, and epidemiological information. The expected result is Negative.  Fact Sheet for Patients:  EntrepreneurPulse.com.au  Fact Sheet for Healthcare Providers:  IncredibleEmployment.be  This test is no t yet approved or cleared by the [place] FDA and  has been authorized for detection and/or diagnosis of SARS-CoV-2 by FDA under an Emergency Use Authorization (EUA). This EUA will remain  in effect (meaning this test can be used) for the duration of the COVID-19 declaration under Section 564(b)(1) of the Act, 21 U.S.C.section 360bbb-3(b)(1), unless the authorization is terminated  or revoked sooner.       Influenza A by PCR NEGATIVE NEGATIVE Final   Influenza B by PCR NEGATIVE NEGATIVE Final    Comment: (NOTE) The Xpert Xpress SARS-CoV-2/FLU/RSV plus assay is intended as an aid in the diagnosis of influenza from Nasopharyngeal swab specimens and should not be used as a sole basis for treatment. Nasal washings and aspirates are unacceptable for Xpert Xpress SARS-CoV-2/FLU/RSV testing.  Fact Sheet for Patients: EntrepreneurPulse.com.au  Fact Sheet for Healthcare Providers: IncredibleEmployment.be  This test is not yet approved or cleared by the [place] FDA and has been authorized for detection and/or diagnosis of SARS-CoV-2 by FDA under an Emergency Use Authorization (EUA). This EUA will remain in effect (meaning this test can be used) for the duration of the COVID-19 declaration under Section 564(b)(1) of the Act, 21 U.S.C. section  360bbb-3(b)(1), unless the authorization is terminated or revoked.  Performed at [facility], [place] [address] [place], [place] [address]   Urine culture  Status: Abnormal   Collection Time: 08/25/20  2:43 PM   Specimen: Urine, Random  Result Value Ref Range Status   Specimen Description URINE, RANDOM  Final   Special Requests   Final    NONE Performed at [facility], [address], [place], [place] [address]    Culture MULTIPLE SPECIES PRESENT, SUGGEST RECOLLECTION (A)  Final   Report Status 08/26/2020 FINAL  Final  Blood culture (routine x 2)     Status: None (Preliminary result)   Collection Time: 08/25/20  3:20 PM   Specimen: BLOOD  Result Value Ref Range Status   Specimen Description BLOOD RIGHT ANTECUBITAL  Final   Special Requests   Final    BOTTLES DRAWN AEROBIC AND ANAEROBIC Blood Culture results may not be optimal due to an excessive volume of blood received in culture bottles   Culture   Final    NO GROWTH 2 DAYS Performed at [facility], [place] [address], [place], [place] [address]    Report Status PENDING  Incomplete  Blood culture (routine x 2)     Status: None (Preliminary result)   Collection Time: 08/25/20  4:55 PM   Specimen: BLOOD LEFT WRIST  Result Value Ref Range Status   Specimen Description BLOOD LEFT WRIST  Final   Special Requests   Final    BOTTLES DRAWN AEROBIC AND ANAEROBIC Blood Culture adequate volume   Culture   Final    NO GROWTH 2 DAYS Performed at [facility], [place] [address], [place], [place] [address]    Report Status PENDING  Incomplete    FURTHER DISCHARGE INSTRUCTIONS:  Get Medicines reviewed and adjusted: Please take all your medications with you for your next visit with your Primary MD  Laboratory/radiological data: Please request your Primary MD to go over all hospital tests and procedure/radiological results at the follow up, please ask your Primary MD to get all Hospital records sent to his/her  office.  In some cases, they will be blood work, cultures and biopsy results pending at the time of your discharge. Please request that your primary care M.D. goes through all the records of your hospital data and follows up on these results.  Also Note the following: If you experience worsening of your admission symptoms, develop shortness of breath, life threatening emergency, suicidal or homicidal thoughts you must seek medical attention immediately by calling 911 or calling your MD immediately  if symptoms less severe.  You must read complete instructions/literature along with all the possible adverse reactions/side effects for all the Medicines you take and that have been prescribed to you. Take any new Medicines after you have completely understood and accpet all the possible adverse reactions/side effects.   Do not drive when taking Pain medications or sleeping medications (Benzodaizepines)  Do not take more than prescribed Pain, Sleep and Anxiety Medications. It is not advisable to combine anxiety,sleep and pain medications without talking with your primary care practitioner  Special Instructions: If you have smoked or chewed Tobacco  in the last 2 yrs please stop smoking, stop any regular Alcohol  and or any Recreational drug use.  Wear Seat belts while driving.  Please note: You were cared for by a hospitalist during your hospital stay. Once you are discharged, your primary care physician will handle any further medical issues. Please note that NO REFILLS for any discharge medications will be authorized once you are discharged, as it is imperative that you return to your  primary care physician (or establish a relationship with a primary care physician if you do not have one) for your post hospital discharge needs so that they can reassess your need for medications and monitor your lab values.  Total Time spent coordinating discharge including counseling, education and face to face time  equals 35 minutes.  SignedOren Binet 08/27/2020 10:37 AM

## 2020-08-30 LAB — CULTURE, BLOOD (ROUTINE X 2)
Culture: NO GROWTH
Culture: NO GROWTH
Special Requests: ADEQUATE

## 2020-09-02 ENCOUNTER — Other Ambulatory Visit (HOSPITAL_COMMUNITY): Payer: Self-pay

## 2020-09-02 ENCOUNTER — Other Ambulatory Visit (HOSPITAL_BASED_OUTPATIENT_CLINIC_OR_DEPARTMENT_OTHER): Payer: Self-pay

## 2020-09-02 ENCOUNTER — Telehealth (HOSPITAL_BASED_OUTPATIENT_CLINIC_OR_DEPARTMENT_OTHER): Payer: Self-pay | Admitting: Pharmacist

## 2020-09-02 NOTE — Telephone Encounter (Signed)
Transitions of Care Pharmacy   Call attempted for a pharmacy transitions of care follow-up. Patient's son answered the phone. Patient was not home at the time. Requesting a call back at a later time.   Call attempt #1. Will continue to follow.   Harriet Pho, PharmD Clinical Pharmacist [facility]  09/02/2020 2:00 PM

## 2020-09-09 ENCOUNTER — Other Ambulatory Visit (HOSPITAL_BASED_OUTPATIENT_CLINIC_OR_DEPARTMENT_OTHER): Payer: Self-pay

## 2020-09-09 ENCOUNTER — Other Ambulatory Visit (HOSPITAL_COMMUNITY): Payer: Self-pay

## 2020-09-09 NOTE — Telephone Encounter (Signed)
Pharmacy Transitions of Care Follow-up Telephone Call  Date of discharge: 08/27/2020  Discharge Diagnosis: seizure-like activity  Spoke to patient's daughter today. Erik Hines is doing well. Has better days than others. Drinking, eating and sleeping well. The daughter and step-son have been helping with Erik Hines care since hospital discharge. No signs or symptoms of bleeding. He has had several follow-up appointments so far. Renal function had a slight decline, however, labs from last week show improvements.   Medication changes made at discharge:  - START: eliquis starter pack, keppra, vitamin b12  Medication changes verified by the patient? Confirmed by patient's daughter   Medication Accessibility:  Home Pharmacy: [facility] on [address] in [place]   Was the patient provided with refills on discharged medications? Yes    Have all prescriptions been transferred from [facility] to home pharmacy? Completed now.   Is the patient able to afford medications? Unknown - discussed with patient's daughter the estimated monthly copay for Eliquis with [employer]. She believes he may have a Medicare Part D plan as well.  Notable copays: $38/month     Medication Review: APIXABAN (ELIQUIS)  Apixaban 5 mg BID. - Discussed importance of taking medication around the same time everyday  - Reviewed potential DDIs with patient  - Advised patient of medications to avoid (NSAIDs, ASA)  - Educated that Tylenol (acetaminophen) will be the preferred analgesic to prevent risk of bleeding  - Emphasized importance of monitoring for signs and symptoms of bleeding (abnormal bruising, prolonged bleeding, nose bleeds, bleeding from gums, discolored urine, black tarry stools)  - Advised patient to alert all providers of anticoagulation therapy prior to starting a new medication or having a procedure    Follow-up Appointments:  PCP follow-up with Adrienne Mocha on 09/11/2020:  Confirmed  Cardiology follow-up with Laurann Montana on 09/17/2020: Confirmed   If their condition worsens, is the pt aware to call PCP or go to the Emergency Dept.? Yes  Harriet Pho, PharmD Clinical Pharmacist [facility]  09/09/2020 2:57 PM

## 2020-09-12 ENCOUNTER — Other Ambulatory Visit: Payer: Self-pay | Admitting: Radiation Therapy

## 2020-09-16 NOTE — Progress Notes (Signed)
Office Visit    Patient Name: Erik Hines Date of Encounter: 09/17/2020  PCP:  Pieter Partridge, PA   [place]  Cardiologist:  Evalina Field, MD  Advanced Practice Provider:  No care team member to display Electrophysiologist:  None   Chief Complaint    Erik Hines is a 80 y.o. male with a hx of CAD s/p CABG, HTN, HLD, Dm2, hearing loss, dementia, atrial fibrillation, aortic stenosis, meningioma presents today for hospital follow up   Past Medical History    Past Medical History:  Diagnosis Date   Aortic stenosis    Atrial fibrillation ([place])    CAD (coronary artery disease)    DM2 (diabetes mellitus, type 2) ([place])    HLD (hyperlipidemia)    HTN (hypertension)    LBBB (left bundle branch block) 08/25/2020   S/P CABG (coronary artery bypass graft)    History reviewed. No pertinent surgical history.  Allergies  No Known Allergies  History of Present Illness    Erik Hines is a 80 y.o. male with a hx of CAD s/p CABG, HTN, HLD, Dm2, hearing loss, dementia, atrial fibrillation, aortic stenosis, meningioma  last seen while hospitalized.  He was admitted 08/25/20 with new onset seizure witnessed by his son. He had brief episode of atrial fibrillation with RVR. Of note, this was previously noted by his primary care provider in [place]. He had CT head with right parietal meningioma. 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 Echo 08/26/20 with moderate assymetric hypertrophy, moderate to severe AS. He was discharged on Keppra. Meningioma was thought to be provoking seizures.   He presents today for follow up with his daughter.  We reviewed hospitalization and echocardiogram in detail. Notes no recurrent seizures. Notes no chest pain, pressure, tightness. Notes no shortness of breath at rest and mild dyspnea on exertion with more than usual activity. Energy level is overall improving. He enjoys reading and research in  his spare time.  Tells me he has done a lot of research about seizure since his hospital discharge. Tolerating Eliquis without bleeding complications.  Has appointment with podiatry today and also with neurology in next week.  He has seen neurosurgery since discharge and plan is to continue to monitor his meningioma, no surgical plans with time.  EKGs/Labs/Other Studies Reviewed:   The following studies were reviewed today:  Echo 08/26/20:  1. Left ventricular ejection fraction, by estimation, is 55 to 60%. The  left ventricle has normal function. The left ventricle has no regional  wall motion abnormalities. There is moderate asymmetric hypertrophy of the  basal septum. The rest of the LV  segments demonstrate mild left ventricular hypertrophy. Left ventricular  diastolic parameters are consistent with Grade II diastolic dysfunction  (pseudonormalization).   2. Right ventricular systolic function is low normal. The right  ventricular size is normal. There is normal pulmonary artery systolic  pressure.   3. Left atrial size was mildly dilated.   4. A small pericardial effusion is present. The pericardial effusion is  posterior to the left ventricle.   5. The mitral valve is degenerative. Mild mitral valve regurgitation.  Moderate mitral annular calcification.   6. The aortic valve is calcified. There is severe calcifcation of the  aortic valve. There is severe thickening of the aortic valve. Aortic valve  regurgitation is mild. 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 AVA  0.9cm2, mean gradient 451mHg, peak  gradient 33mg, Vmax 2.51m12m DI  0.23   7. The inferior vena cava is normal in size with greater than 50%  respiratory variability, suggesting right atrial pressure of 3 mmHg.    EKG:  EKG is ordered today.  The ekg ordered today demonstrates NSR 71 bpm with first degree AV block (PR 324m) with no acute St/T wave changes.   Recent Labs: 08/25/2020: B Natriuretic Peptide 471.3;  TSH 1.680 08/26/2020: Hemoglobin 10.8; Magnesium 1.7; Platelets 220 08/27/2020: ALT 16; BUN 15; Creatinine, Ser 1.41; Potassium 4.1; Sodium 138  Recent Lipid Panel No results found for: CHOL, TRIG, HDL, CHOLHDL, VLDL, LDLCALC, LDLDIRECT   Home Medications   Current Meds  Medication Sig   apixaban (ELIQUIS) 5 MG TABS tablet Take 1 tablet (5 mg total) by mouth 2 (two) times daily.   atorvastatin (LIPITOR) 40 MG tablet Take 40 mg by mouth daily.   cyanocobalamin 1000 MCG tablet Take 1 tablet (1,000 mcg total) by mouth daily.   ergocalciferol (VITAMIN D2) 1.25 MG (50000 UT) capsule Take 1 capsule by mouth daily.   finasteride (PROSCAR) 5 MG tablet Take 5 mg by mouth daily.   insulin detemir (LEVEMIR) 100 UNIT/ML injection Inject 18 Units into the skin 2 (two) times daily.   levETIRAcetam (KEPPRA) 500 MG tablet Take 1 tablet (500 mg total) by mouth 2 (two) times daily.   lisinopril-hydrochlorothiazide (ZESTORETIC) 10-12.5 MG tablet Take 1 tablet by mouth daily.   metFORMIN (GLUCOPHAGE) 1000 MG tablet Take 1,000 mg by mouth 2 (two) times daily.   tamsulosin (FLOMAX) 0.4 MG CAPS capsule Take 1 capsule by mouth daily.     Review of Systems      All other systems reviewed and are otherwise negative except as noted above.  Physical Exam    VS:  BP 132/62   Pulse 71   Ht 5' 8.5" (1.74 m)   Wt 175 lb (79.4 kg)   SpO2 99%   BMI 26.22 kg/m  , BMI Body mass index is 26.22 kg/m.  Wt Readings from Last 3 Encounters:  09/17/20 175 lb (79.4 kg)  08/27/20 180 lb (81.6 kg)     GEN: Well nourished, well developed, in no acute distress. HEENT: normal. Neck: Supple, no JVD, carotid bruits, or masses. Cardiac: RRR, no  rubs, or gallops. Gr 3/6 systolic murmur. No clubbing, cyanosis, edema.  Radials/PT 2+ and equal bilaterally.  Respiratory:  Respirations regular and unlabored, clear to auscultation bilaterally. GI: Soft, nontender, nondistended. MS: No deformity or atrophy. Skin: Warm and dry,  no rash. Neuro:  Strength and sensation are intact. Psych: Normal affect.  Assessment & Plan    Seizure like activity / Meningioma - Follow with PCP and neurosurgery.  No recurrent seizures.  Tolerating Keppra without difficulty.  CAD s/p CABG (radial-OM, SVG-RCA, SVG-LAD 2003) -  Stable with no anginal symptoms.  [place]  GDMT includes atorvastatin.  No beta-blocker due to baseline bradycardia.  No aspirin due to chronic anticoagulation.  Given moderate to severe aortic stenosis for further work-up plan for right and left cardiac catheterization, as below.  Atrial fibrillation / Chronic anticoagulation / LBBB - no AV nodal blocking agent due to bradycardia.  Maintaining normal sinus rhythm by EKG today.  Continue Eliquis 5 mg twice daily.  He will hold Eliquis 48 hours prior to his cardiac catheterization.  Moderate to severe aortic stenosis - [facility] [place] Echo 08/26/20 mean gradient 133mg which is mild but DI 0.23. Refer to valve clinic  to see Dr. Burt Knack or Dr. Angelena Form for consideration of TAVR. Pre-TAVR workup discussed including R/L cardiac catheterization, CT. He is interested in pursuing.   Shared Decision Making/Informed Consent{ The risks [stroke (1 in 1000), death (1 in 1000), kidney failure [usually temporary] (1 in 500), bleeding (1 in 200), allergic reaction [possibly serious] (1 in 200)], benefits (diagnostic support and management of coronary artery disease) and alternatives of a cardiac catheterization were discussed in detail with Mr. Boies and he is willing to proceed.   DM2 -continue metformin.  Continue to follow with primary care provider.  If additional agent needed consider SGLT2 for cardioprotective benefit.  CKD 3 - Careful titration of diuretic and antihypertensive.    Disposition: Follow up with TAVR clinic after cardiac catheterization.  Signed, Loel Dubonnet, NP 09/17/2020, 9:46 AM [place]

## 2020-09-16 NOTE — H&P (View-Only) (Signed)
Office Visit    Patient Name: Erik Hines Date of Encounter: 09/17/2020  PCP:  Pieter Partridge, PA   [place]  Cardiologist:  Evalina Field, MD  Advanced Practice Provider:  No care team member to display Electrophysiologist:  None   Chief Complaint    Erik Hines is a 80 y.o. male with a hx of CAD s/p CABG, HTN, HLD, Dm2, hearing loss, dementia, atrial fibrillation, aortic stenosis, meningioma presents today for hospital follow up   Past Medical History    Past Medical History:  Diagnosis Date   Aortic stenosis    Atrial fibrillation ([place])    CAD (coronary artery disease)    DM2 (diabetes mellitus, type 2) ([place])    HLD (hyperlipidemia)    HTN (hypertension)    LBBB (left bundle branch block) 08/25/2020   S/P CABG (coronary artery bypass graft)    History reviewed. No pertinent surgical history.  Allergies  No Known Allergies  History of Present Illness    Erik Hines is a 80 y.o. male with a hx of CAD s/p CABG, HTN, HLD, Dm2, hearing loss, dementia, atrial fibrillation, aortic stenosis, meningioma  last seen while hospitalized.  He was admitted 08/25/20 with new onset seizure witnessed by his son. He had brief episode of atrial fibrillation with RVR. Of note, this was previously noted by his primary care provider in [place]. He had CT head with right parietal meningioma. 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 Echo 08/26/20 with moderate assymetric hypertrophy, moderate to severe AS. He was discharged on Keppra. Meningioma was thought to be provoking seizures.   He presents today for follow up with his daughter.  We reviewed hospitalization and echocardiogram in detail. Notes no recurrent seizures. Notes no chest pain, pressure, tightness. Notes no shortness of breath at rest and mild dyspnea on exertion with more than usual activity. Energy level is overall improving. He enjoys reading and research in  his spare time.  Tells me he has done a lot of research about seizure since his hospital discharge. Tolerating Eliquis without bleeding complications.  Has appointment with podiatry today and also with neurology in next week.  He has seen neurosurgery since discharge and plan is to continue to monitor his meningioma, no surgical plans with time.  EKGs/Labs/Other Studies Reviewed:   The following studies were reviewed today:  Echo 08/26/20:  1. Left ventricular ejection fraction, by estimation, is 55 to 60%. The  left ventricle has normal function. The left ventricle has no regional  wall motion abnormalities. There is moderate asymmetric hypertrophy of the  basal septum. The rest of the LV  segments demonstrate mild left ventricular hypertrophy. Left ventricular  diastolic parameters are consistent with Grade II diastolic dysfunction  (pseudonormalization).   2. Right ventricular systolic function is low normal. The right  ventricular size is normal. There is normal pulmonary artery systolic  pressure.   3. Left atrial size was mildly dilated.   4. A small pericardial effusion is present. The pericardial effusion is  posterior to the left ventricle.   5. The mitral valve is degenerative. Mild mitral valve regurgitation.  Moderate mitral annular calcification.   6. The aortic valve is calcified. There is severe calcifcation of the  aortic valve. There is severe thickening of the aortic valve. Aortic valve  regurgitation is mild. 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 AVA  0.9cm2, mean gradient 26mHg, peak  gradient 334mg, Vmax 2.79m41m DI  0.23   7. The inferior vena cava is normal in size with greater than 50%  respiratory variability, suggesting right atrial pressure of 3 mmHg.    EKG:  EKG is ordered today.  The ekg ordered today demonstrates NSR 71 bpm with first degree AV block (PR 367m) with no acute St/T wave changes.   Recent Labs: 08/25/2020: B Natriuretic Peptide 471.3;  TSH 1.680 08/26/2020: Hemoglobin 10.8; Magnesium 1.7; Platelets 220 08/27/2020: ALT 16; BUN 15; Creatinine, Ser 1.41; Potassium 4.1; Sodium 138  Recent Lipid Panel No results found for: CHOL, TRIG, HDL, CHOLHDL, VLDL, LDLCALC, LDLDIRECT   Home Medications   Current Meds  Medication Sig   apixaban (ELIQUIS) 5 MG TABS tablet Take 1 tablet (5 mg total) by mouth 2 (two) times daily.   atorvastatin (LIPITOR) 40 MG tablet Take 40 mg by mouth daily.   cyanocobalamin 1000 MCG tablet Take 1 tablet (1,000 mcg total) by mouth daily.   ergocalciferol (VITAMIN D2) 1.25 MG (50000 UT) capsule Take 1 capsule by mouth daily.   finasteride (PROSCAR) 5 MG tablet Take 5 mg by mouth daily.   insulin detemir (LEVEMIR) 100 UNIT/ML injection Inject 18 Units into the skin 2 (two) times daily.   levETIRAcetam (KEPPRA) 500 MG tablet Take 1 tablet (500 mg total) by mouth 2 (two) times daily.   lisinopril-hydrochlorothiazide (ZESTORETIC) 10-12.5 MG tablet Take 1 tablet by mouth daily.   metFORMIN (GLUCOPHAGE) 1000 MG tablet Take 1,000 mg by mouth 2 (two) times daily.   tamsulosin (FLOMAX) 0.4 MG CAPS capsule Take 1 capsule by mouth daily.     Review of Systems      All other systems reviewed and are otherwise negative except as noted above.  Physical Exam    VS:  BP 132/62   Pulse 71   Ht 5' 8.5" (1.74 m)   Wt 175 lb (79.4 kg)   SpO2 99%   BMI 26.22 kg/m  , BMI Body mass index is 26.22 kg/m.  Wt Readings from Last 3 Encounters:  09/17/20 175 lb (79.4 kg)  08/27/20 180 lb (81.6 kg)     GEN: Well nourished, well developed, in no acute distress. HEENT: normal. Neck: Supple, no JVD, carotid bruits, or masses. Cardiac: RRR, no  rubs, or gallops. Gr 3/6 systolic murmur. No clubbing, cyanosis, edema.  Radials/PT 2+ and equal bilaterally.  Respiratory:  Respirations regular and unlabored, clear to auscultation bilaterally. GI: Soft, nontender, nondistended. MS: No deformity or atrophy. Skin: Warm and dry,  no rash. Neuro:  Strength and sensation are intact. Psych: Normal affect.  Assessment & Plan    Seizure like activity / Meningioma - Follow with PCP and neurosurgery.  No recurrent seizures.  Tolerating Keppra without difficulty.  CAD s/p CABG (radial-OM, SVG-RCA, SVG-LAD 2003) -  Stable with no anginal symptoms.  [place]  GDMT includes atorvastatin.  No beta-blocker due to baseline bradycardia.  No aspirin due to chronic anticoagulation.  Given moderate to severe aortic stenosis for further work-up plan for right and left cardiac catheterization, as below.  Atrial fibrillation / Chronic anticoagulation / LBBB - no AV nodal blocking agent due to bradycardia.  Maintaining normal sinus rhythm by EKG today.  Continue Eliquis 5 mg twice daily.  He will hold Eliquis 48 hours prior to his cardiac catheterization.  Moderate to severe aortic stenosis - [facility] [place] Echo 08/26/20 mean gradient 15mg which is mild but DI 0.23. Refer to valve clinic  to see Dr. Burt Knack or Dr. Angelena Form for consideration of TAVR. Pre-TAVR workup discussed including R/L cardiac catheterization, CT. He is interested in pursuing.   Shared Decision Making/Informed Consent{ The risks [stroke (1 in 1000), death (1 in 1000), kidney failure [usually temporary] (1 in 500), bleeding (1 in 200), allergic reaction [possibly serious] (1 in 200)], benefits (diagnostic support and management of coronary artery disease) and alternatives of a cardiac catheterization were discussed in detail with Erik Hines and he is willing to proceed.   DM2 -continue metformin.  Continue to follow with primary care provider.  If additional agent needed consider SGLT2 for cardioprotective benefit.  CKD 3 - Careful titration of diuretic and antihypertensive.    Disposition: Follow up with TAVR clinic after cardiac catheterization.  Signed, Loel Dubonnet, NP 09/17/2020, 9:46 AM [place]

## 2020-09-17 ENCOUNTER — Other Ambulatory Visit: Payer: Self-pay

## 2020-09-17 ENCOUNTER — Ambulatory Visit (INDEPENDENT_AMBULATORY_CARE_PROVIDER_SITE_OTHER): Payer: Medicare Other | Admitting: Podiatry

## 2020-09-17 ENCOUNTER — Encounter (HOSPITAL_BASED_OUTPATIENT_CLINIC_OR_DEPARTMENT_OTHER): Payer: Self-pay | Admitting: Family

## 2020-09-17 ENCOUNTER — Ambulatory Visit (INDEPENDENT_AMBULATORY_CARE_PROVIDER_SITE_OTHER): Payer: Medicare Other | Admitting: Family

## 2020-09-17 ENCOUNTER — Telehealth: Payer: Self-pay

## 2020-09-17 VITALS — BP 132/62 | HR 71 | Ht 68.5 in | Wt 175.0 lb

## 2020-09-17 DIAGNOSIS — Z01818 Encounter for other preprocedural examination: Secondary | ICD-10-CM

## 2020-09-17 DIAGNOSIS — Z7901 Long term (current) use of anticoagulants: Secondary | ICD-10-CM

## 2020-09-17 DIAGNOSIS — I25118 Atherosclerotic heart disease of native coronary artery with other forms of angina pectoris: Secondary | ICD-10-CM | POA: Diagnosis not present

## 2020-09-17 DIAGNOSIS — E785 Hyperlipidemia, unspecified: Secondary | ICD-10-CM

## 2020-09-17 DIAGNOSIS — I480 Paroxysmal atrial fibrillation: Secondary | ICD-10-CM

## 2020-09-17 DIAGNOSIS — M79674 Pain in right toe(s): Secondary | ICD-10-CM | POA: Diagnosis not present

## 2020-09-17 DIAGNOSIS — E119 Type 2 diabetes mellitus without complications: Secondary | ICD-10-CM | POA: Diagnosis not present

## 2020-09-17 DIAGNOSIS — M79675 Pain in left toe(s): Secondary | ICD-10-CM | POA: Diagnosis not present

## 2020-09-17 DIAGNOSIS — I350 Nonrheumatic aortic (valve) stenosis: Secondary | ICD-10-CM

## 2020-09-17 DIAGNOSIS — B351 Tinea unguium: Secondary | ICD-10-CM | POA: Diagnosis not present

## 2020-09-17 DIAGNOSIS — I10 Essential (primary) hypertension: Secondary | ICD-10-CM

## 2020-09-17 MED ORDER — APIXABAN 5 MG PO TABS: 5.0000 mg | ORAL_TABLET | Freq: Two times a day (BID) | ORAL | 2 refills | Status: AC

## 2020-09-17 NOTE — Patient Instructions (Addendum)
Medication Instructions:  Continue your current medications.   *If you need a refill on your cardiac medications before your next appointment, please call your pharmacy*  Lab Work: Your physician recommends lab work: BMP, CBC at [facility] this week at your convenience  If you have labs (blood work) drawn today and your tests are completely normal, you will receive your results only by: [place] (if you have [place]) OR A paper copy in the mail If you have any lab test that is abnormal or we need to change your treatment, we will call you to review the results.   Testing/Procedures: Your physician has requested that you have a cardiac catheterization. Cardiac catheterization is used to diagnose and/or treat various heart conditions. Doctors may recommend this procedure for a number of different reasons. The most common reason is to evaluate chest pain. Chest pain can be a symptom of coronary artery disease (CAD), and cardiac catheterization can show whether plaque is narrowing or blocking your heart's arteries. This procedure is also used to evaluate the valves, as well as measure the blood flow and oxygen levels in different parts of your heart.  Please follow instruction sheet, as given.   Follow-Up: At [facility], you and your health needs are our priority.  As part of our continuing mission to provide you with exceptional heart care, we have created designated Provider Care Teams.  These Care Teams include your primary Cardiologist (physician) and Advanced Practice Providers (APPs -  Physician Assistants and Nurse Practitioners) who all work together to provide you with the care you need, when you need it.  We recommend signing up for the patient portal called "MyChart".  Sign up information is provided on this After Visit Summary.  MyChart is used to connect with patients for Virtual Visits (Telemedicine).  Patients are able to view lab/test results, encounter notes, upcoming  appointments, etc.  Non-urgent messages can be sent to your provider as well.   To learn more about what you can do with MyChart, go to [email or web address].    Your next appointment:   2 week   Other Instructions   [place] [facility] [place] [address] Dept: [phone]  Bernie Delconte  09/17/2020  You are scheduled for a Cardiac Catheterization on Tuesday, August 9 with Dr. [name].  1. Please arrive at the [facility] (Main Entrance A) at [facility]: [address] [place], [place] [address] at 5:30 AM (This time is two hours before your procedure to ensure your preparation). Free valet parking service is available.   Special note: Every effort is made to have your procedure done on time. Please understand that emergencies sometimes delay scheduled procedures.  2. Diet: Do not eat solid foods after midnight.  The patient may have clear liquids until 5am upon the day of the procedure.  3. Labs: You will need to have blood drawn sometime 09/17/20-09/20/20 at [facility] for BMP, CBC. You do not need to be fasting.  4. Medication instructions in preparation for your procedure:   Contrast Allergy: No  Stop taking Eliquis (Apixiban) on Sunday, August 7.  Stop taking, Lisinopril-HCTZ ,   on Monday, August 8th.   Take only 9 units of insulin the night before your procedure. Do not take any insulin on the day of the procedure.  Do not take Diabetes Med Glucophage (Metformin) on the day of the procedure and HOLD 48 HOURS AFTER THE PROCEDURE.  On the  morning of your procedure, take your Aspirin and any morning medicines NOT listed above.  You may use sips of water.  5. Plan for one night stay--bring personal belongings. 6. Bring a current list of your medications and current insurance cards. 7. You MUST have a responsible person to drive you home. 8. Someone MUST be with you the  first 24 hours after you arrive home or your discharge will be delayed. 9. Please wear clothes that are easy to get on and off and wear slip-on shoes.  Thank you for allowing [place] to care for you!   -- [place] [facility]

## 2020-09-17 NOTE — Telephone Encounter (Signed)
Per Laurann Montana, scheduled TAVR consult with Dr. Burt Knack 8/15. The patient's daughter was grateful for call and agrees with plan.   Per request, sent MyChart sign-up information and will send a message with appointment info once they have signed up.

## 2020-09-18 MED ORDER — SODIUM CHLORIDE 0.9% FLUSH: 3.0000 mL | Freq: Two times a day (BID) | INTRAVENOUS | Status: AC

## 2020-09-19 ENCOUNTER — Encounter (HOSPITAL_BASED_OUTPATIENT_CLINIC_OR_DEPARTMENT_OTHER): Payer: Self-pay

## 2020-09-20 ENCOUNTER — Telehealth: Payer: Self-pay | Admitting: Radiation Therapy

## 2020-09-20 NOTE — Telephone Encounter (Signed)
I spoke with the patient's daughter regarding the referral from Dr. Kathyrn Sheriff, for her father to see Dr. Mickeal Skinner. Erik Hines case was reviewed during the 8/1/ brain and spine conference. Dr. Kathyrn Sheriff has referred him to see Dr. Mickeal Skinner for surveillance of his meningioma and seizure management.   Erik Hines was very happy for the call and explanation of the referral. She requested that I cancel the visit with [facility] on 9/8 and schedule her father to be seen that day by Dr. Mickeal Skinner.   [name] R.T.(R)(T) Radiation Special Procedures Navigator

## 2020-09-21 LAB — BASIC METABOLIC PANEL
BUN/Creatinine Ratio: 10 (ref 10–24)
BUN: 15 mg/dL (ref 8–27)
CO2: 19 mmol/L — ABNORMAL LOW (ref 20–29)
Calcium: 8.7 mg/dL (ref 8.6–10.2)
Chloride: 104 mmol/L (ref 96–106)
Creatinine, Ser: 1.45 mg/dL — ABNORMAL HIGH (ref 0.76–1.27)
Glucose: 161 mg/dL — ABNORMAL HIGH (ref 65–99)
Potassium: 5.2 mmol/L (ref 3.5–5.2)
Sodium: 144 mmol/L (ref 134–144)
eGFR: 48 mL/min/{1.73_m2} — ABNORMAL LOW (ref >59)

## 2020-09-21 LAB — CBC
Hematocrit: 34.4 % — ABNORMAL LOW (ref 37.5–51.0)
Hemoglobin: 11.3 g/dL — ABNORMAL LOW (ref 13.0–17.7)
MCH: 29 pg (ref 26.6–33.0)
MCHC: 32.8 g/dL (ref 31.5–35.7)
MCV: 88 fL (ref 79–97)
Platelets: 321 10*3/uL (ref 150–450)
RBC: 3.89 x10E6/uL — ABNORMAL LOW (ref 4.14–5.80)
RDW: 12.8 % (ref 11.6–15.4)
WBC: 11.1 10*3/uL — ABNORMAL HIGH (ref 3.4–10.8)

## 2020-09-22 ENCOUNTER — Encounter: Payer: Self-pay | Admitting: Podiatry

## 2020-09-22 NOTE — Progress Notes (Signed)
  Subjective:  Patient ID: Erik Hines, male    DOB: [birth year],  MRN: [id number]  Chief Complaint  Patient presents with   Nail Problem    Diabetic routine foot care - pt states he has never cut his nails    80 y.o. male presents with the above complaint. History confirmed with patient.  His A1c is 8.4%.  He is here with his daughter.  They recently moved here from [place] not long ago  Objective:  Physical Exam: warm, good capillary refill, no trophic changes or ulcerative lesions, normal DP and PT pulses, normal monofilament exam, and normal sensory exam. Left Foot: dystrophic yellowed discolored nail plates with subungual debris Right Foot: dystrophic yellowed discolored nail plates with subungual debris  Assessment:   1. Pain due to onychomycosis of toenails of both feet   2. Type 2 diabetes mellitus without complication, without long-term current use of insulin ([place])      Plan:  Patient was evaluated and treated and all questions answered.  Patient educated on diabetes. Discussed proper diabetic foot care and discussed risks and complications of disease. Educated patient in depth on reasons to return to the office immediately should he/she discover anything concerning or new on the feet. All questions answered. Discussed proper shoes as well.   Discussed the etiology and treatment options for the condition in detail with the patient. Educated patient on the topical and oral treatment options for mycotic nails. Recommended debridement of the nails today. Sharp and mechanical debridement performed of all painful and mycotic nails today. Nails debrided in length and thickness using a nail nipper to level of comfort. Discussed treatment options including appropriate shoe gear. Follow up as needed for painful nails.    No follow-ups on file.

## 2020-09-23 ENCOUNTER — Telehealth: Payer: Self-pay | Admitting: *Deleted

## 2020-09-23 NOTE — Telephone Encounter (Addendum)
Cardiac catheterization scheduled at [facility] for: Tuesday September 24, 2020 7:30 [facility] Main Entrance [facility]) at: 5:30 AM   No solid food after midnight prior to cath, clear liquids until 5 AM day of procedure.   Medication instructions:  Hold:  -Eliquis-none 09/22/20 until post procedure -Lisinopril/HCT-day before and day of procedure-GFR 48 -Metformin-day of procedure and 48 hours post procedure -Insulin-AM of procedure/1/2 usual Insulin HS prior to procedure   Except hold medications AM meds can be  taken pre-cath with sips of water including aspirin 81 mg.   Confirmed patient has responsible adult to drive home post procedure and be with patient first 24 hours after arriving home.  Patients are allowed one visitor in the waiting room during the time they are at the hospital for their procedure. Both patient and visitor must wear a mask once they enter the hospital.   Patient reports does not currently have any symptoms concerning for COVID-19 and no household members with COVID-19 like illness.       Reviewed procedure/mask/visitor instructions with patient's daughter (DPR), Beth. Asked Beth to encourage patient to drink water today prior to cath tomorrow.

## 2020-09-24 ENCOUNTER — Other Ambulatory Visit: Payer: Self-pay

## 2020-09-24 ENCOUNTER — Encounter (HOSPITAL_COMMUNITY)
Admission: RE | Disposition: A | Discharge status: Home / Self Care | Payer: Self-pay | Source: Home / Self Care | Attending: Cardiology

## 2020-09-24 ENCOUNTER — Encounter (HOSPITAL_COMMUNITY): Payer: Self-pay | Admitting: Cardiology

## 2020-09-24 ENCOUNTER — Ambulatory Visit (HOSPITAL_COMMUNITY)
Admission: RE | Admit: 2020-09-24 | Discharge: 2020-09-24 | Disposition: A | Discharge status: Home / Self Care | Payer: Medicare Other | Attending: Cardiology | Admitting: Cardiology

## 2020-09-24 DIAGNOSIS — I2582 Chronic total occlusion of coronary artery: Secondary | ICD-10-CM | POA: Diagnosis not present

## 2020-09-24 DIAGNOSIS — Z79899 Other long term (current) drug therapy: Secondary | ICD-10-CM | POA: Insufficient documentation

## 2020-09-24 DIAGNOSIS — I25810 Atherosclerosis of coronary artery bypass graft(s) without angina pectoris: Secondary | ICD-10-CM

## 2020-09-24 DIAGNOSIS — Z7984 Long term (current) use of oral hypoglycemic drugs: Secondary | ICD-10-CM | POA: Insufficient documentation

## 2020-09-24 DIAGNOSIS — H9190 Unspecified hearing loss, unspecified ear: Secondary | ICD-10-CM | POA: Insufficient documentation

## 2020-09-24 DIAGNOSIS — N183 Chronic kidney disease, stage 3 unspecified: Secondary | ICD-10-CM | POA: Diagnosis not present

## 2020-09-24 DIAGNOSIS — I350 Nonrheumatic aortic (valve) stenosis: Secondary | ICD-10-CM | POA: Diagnosis not present

## 2020-09-24 DIAGNOSIS — Z951 Presence of aortocoronary bypass graft: Secondary | ICD-10-CM | POA: Diagnosis not present

## 2020-09-24 DIAGNOSIS — I129 Hypertensive chronic kidney disease with stage 1 through stage 4 chronic kidney disease, or unspecified chronic kidney disease: Secondary | ICD-10-CM | POA: Insufficient documentation

## 2020-09-24 DIAGNOSIS — E1122 Type 2 diabetes mellitus with diabetic chronic kidney disease: Secondary | ICD-10-CM | POA: Insufficient documentation

## 2020-09-24 DIAGNOSIS — I4819 Other persistent atrial fibrillation: Secondary | ICD-10-CM | POA: Diagnosis present

## 2020-09-24 DIAGNOSIS — E785 Hyperlipidemia, unspecified: Secondary | ICD-10-CM | POA: Insufficient documentation

## 2020-09-24 DIAGNOSIS — I2510 Atherosclerotic heart disease of native coronary artery without angina pectoris: Secondary | ICD-10-CM | POA: Diagnosis present

## 2020-09-24 DIAGNOSIS — F0390 Unspecified dementia without behavioral disturbance: Secondary | ICD-10-CM | POA: Diagnosis not present

## 2020-09-24 DIAGNOSIS — I10 Essential (primary) hypertension: Secondary | ICD-10-CM | POA: Diagnosis present

## 2020-09-24 DIAGNOSIS — I447 Left bundle-branch block, unspecified: Secondary | ICD-10-CM | POA: Diagnosis present

## 2020-09-24 DIAGNOSIS — Z7901 Long term (current) use of anticoagulants: Secondary | ICD-10-CM | POA: Insufficient documentation

## 2020-09-24 DIAGNOSIS — I25118 Atherosclerotic heart disease of native coronary artery with other forms of angina pectoris: Secondary | ICD-10-CM

## 2020-09-24 DIAGNOSIS — Z794 Long term (current) use of insulin: Secondary | ICD-10-CM | POA: Diagnosis not present

## 2020-09-24 DIAGNOSIS — I272 Pulmonary hypertension, unspecified: Secondary | ICD-10-CM | POA: Insufficient documentation

## 2020-09-24 DIAGNOSIS — D329 Benign neoplasm of meninges, unspecified: Secondary | ICD-10-CM | POA: Diagnosis not present

## 2020-09-24 DIAGNOSIS — E1169 Type 2 diabetes mellitus with other specified complication: Secondary | ICD-10-CM | POA: Diagnosis present

## 2020-09-24 HISTORY — PX: RIGHT/LEFT HEART CATH AND CORONARY/GRAFT ANGIOGRAPHY: CATH118267

## 2020-09-24 LAB — POCT I-STAT EG7
Acid-base deficit: 1 mmol/L (ref 0.0–2.0)
Acid-base deficit: 2 mmol/L (ref 0.0–2.0)
Bicarbonate: 22.6 mmol/L (ref 20.0–28.0)
Bicarbonate: 23.6 mmol/L (ref 20.0–28.0)
Calcium, Ion: 1.11 mmol/L — ABNORMAL LOW (ref 1.15–1.40)
Calcium, Ion: 1.16 mmol/L (ref 1.15–1.40)
HCT: 31 % — ABNORMAL LOW (ref 39.0–52.0)
HCT: 32 % — ABNORMAL LOW (ref 39.0–52.0)
Hemoglobin: 10.5 g/dL — ABNORMAL LOW (ref 13.0–17.0)
Hemoglobin: 10.9 g/dL — ABNORMAL LOW (ref 13.0–17.0)
O2 Saturation: 77 %
O2 Saturation: 78 %
Potassium: 3.8 mmol/L (ref 3.5–5.1)
Potassium: 3.9 mmol/L (ref 3.5–5.1)
Sodium: 140 mmol/L (ref 135–145)
Sodium: 141 mmol/L (ref 135–145)
TCO2: 24 mmol/L (ref 22–32)
TCO2: 25 mmol/L (ref 22–32)
pCO2, Ven: 37.8 mmHg — ABNORMAL LOW (ref 44.0–60.0)
pCO2, Ven: 39.6 mmHg — ABNORMAL LOW (ref 44.0–60.0)
pH, Ven: 7.383 (ref 7.250–7.430)
pH, Ven: 7.385 (ref 7.250–7.430)
pO2, Ven: 42 mmHg (ref 32.0–45.0)
pO2, Ven: 43 mmHg (ref 32.0–45.0)

## 2020-09-24 LAB — POCT I-STAT 7, (LYTES, BLD GAS, ICA,H+H)
Acid-base deficit: 2 mmol/L (ref 0.0–2.0)
Bicarbonate: 22.5 mmol/L (ref 20.0–28.0)
Calcium, Ion: 1.13 mmol/L — ABNORMAL LOW (ref 1.15–1.40)
HCT: 31 % — ABNORMAL LOW (ref 39.0–52.0)
Hemoglobin: 10.5 g/dL — ABNORMAL LOW (ref 13.0–17.0)
O2 Saturation: 100 %
Potassium: 3.9 mmol/L (ref 3.5–5.1)
Sodium: 140 mmol/L (ref 135–145)
TCO2: 24 mmol/L (ref 22–32)
pCO2 arterial: 36 mmHg (ref 32.0–48.0)
pH, Arterial: 7.405 (ref 7.350–7.450)
pO2, Arterial: 181 mmHg — ABNORMAL HIGH (ref 83.0–108.0)

## 2020-09-24 LAB — GLUCOSE, CAPILLARY
Glucose-Capillary: 142 mg/dL — ABNORMAL HIGH (ref 70–99)
Glucose-Capillary: 174 mg/dL — ABNORMAL HIGH (ref 70–99)

## 2020-09-24 LAB — POCT ACTIVATED CLOTTING TIME: Activated Clotting Time: 167 seconds

## 2020-09-24 SURGERY — RIGHT/LEFT HEART CATH AND CORONARY/GRAFT ANGIOGRAPHY
Anesthesia: LOCAL

## 2020-09-24 MED ORDER — ONDANSETRON HCL 4 MG/2ML IJ SOLN: 4.0000 mg | Freq: Four times a day (QID) | INTRAMUSCULAR | Status: DC | PRN

## 2020-09-24 MED ORDER — HEPARIN (PORCINE) IN NACL 1000-0.9 UT/500ML-% IV SOLN
INTRAVENOUS | Status: AC
  Filled 2020-09-24: qty 500

## 2020-09-24 MED ORDER — ASPIRIN 81 MG PO CHEW: 81.0000 mg | CHEWABLE_TABLET | ORAL | Status: DC

## 2020-09-24 MED ORDER — HEPARIN SODIUM (PORCINE) 1000 UNIT/ML IJ SOLN
INTRAMUSCULAR | Status: AC
  Filled 2020-09-24: qty 1

## 2020-09-24 MED ORDER — LIDOCAINE HCL (PF) 1 % IJ SOLN
INTRAMUSCULAR | Status: AC
  Filled 2020-09-24: qty 30

## 2020-09-24 MED ORDER — SODIUM CHLORIDE 0.9 % IV SOLN: 250.0000 mL | INTRAVENOUS | Status: DC | PRN

## 2020-09-24 MED ORDER — ACETAMINOPHEN 325 MG PO TABS: 650.0000 mg | ORAL_TABLET | ORAL | Status: DC | PRN

## 2020-09-24 MED ORDER — SODIUM CHLORIDE 0.9 % WEIGHT BASED INFUSION: 1.0000 mL/kg/h | INTRAVENOUS | Status: DC

## 2020-09-24 MED ORDER — SODIUM CHLORIDE 0.9% FLUSH: 3.0000 mL | Freq: Two times a day (BID) | INTRAVENOUS | Status: DC

## 2020-09-24 MED ORDER — HYDRALAZINE HCL 20 MG/ML IJ SOLN: 10.0000 mg | INTRAMUSCULAR | Status: DC | PRN

## 2020-09-24 MED ORDER — VERAPAMIL HCL 2.5 MG/ML IV SOLN
INTRAVENOUS | Status: DC | PRN
  Administered 2020-09-24: 10 mL via INTRA_ARTERIAL
  Administered 2020-09-24: 5 mL via INTRA_ARTERIAL

## 2020-09-24 MED ORDER — LABETALOL HCL 5 MG/ML IV SOLN: 10.0000 mg | INTRAVENOUS | Status: DC | PRN

## 2020-09-24 MED ORDER — VERAPAMIL HCL 2.5 MG/ML IV SOLN
INTRAVENOUS | Status: AC
  Filled 2020-09-24: qty 2

## 2020-09-24 MED ORDER — SODIUM CHLORIDE 0.9% FLUSH: 3.0000 mL | INTRAVENOUS | Status: DC | PRN

## 2020-09-24 MED ORDER — HYDRALAZINE HCL 20 MG/ML IJ SOLN
INTRAMUSCULAR | Status: AC
  Filled 2020-09-24: qty 1

## 2020-09-24 MED ORDER — LIDOCAINE HCL (PF) 1 % IJ SOLN
INTRAMUSCULAR | Status: DC | PRN
  Administered 2020-09-24: 10 mL
  Administered 2020-09-24: 3 mL
  Administered 2020-09-24: 2 mL

## 2020-09-24 MED ORDER — SODIUM CHLORIDE 0.9 % WEIGHT BASED INFUSION
3.0000 mL/kg/h | INTRAVENOUS | Status: AC
  Administered 2020-09-24: 3 mL/kg/h via INTRAVENOUS

## 2020-09-24 MED ORDER — HEPARIN SODIUM (PORCINE) 1000 UNIT/ML IJ SOLN
INTRAMUSCULAR | Status: DC | PRN
  Administered 2020-09-24: 4000 [IU] via INTRAVENOUS

## 2020-09-24 MED ORDER — SODIUM CHLORIDE 0.9 % IV SOLN: INTRAVENOUS | Status: AC

## 2020-09-24 MED ORDER — IOHEXOL 350 MG/ML SOLN
INTRAVENOUS | Status: DC | PRN
  Administered 2020-09-24: 155 mL

## 2020-09-24 MED ORDER — HEPARIN (PORCINE) IN NACL 1000-0.9 UT/500ML-% IV SOLN
INTRAVENOUS | Status: DC | PRN
  Administered 2020-09-24 (×3): 500 mL

## 2020-09-24 SURGICAL SUPPLY — 24 items
CATH BALLN WEDGE 5F 110CM (CATHETERS) ×2 IMPLANT
CATH INFINITI 5 FR AR1 MOD (CATHETERS) ×2 IMPLANT
CATH INFINITI 5 FR JL3.5 (CATHETERS) ×2 IMPLANT
CATH INFINITI 5 FR LCB (CATHETERS) ×2 IMPLANT
CATH INFINITI 5FR ANG PIGTAIL (CATHETERS) ×2 IMPLANT
CATH INFINITI JR4 5F (CATHETERS) ×2 IMPLANT
CATH LAUNCHER 5F EBU3.0 (CATHETERS) ×1 IMPLANT
CATH OPTITORQUE TIG 4.0 5F (CATHETERS) ×2 IMPLANT
CATHETER LAUNCHER 5F EBU3.0 (CATHETERS) ×2
DEVICE RAD COMP TR BAND LRG (VASCULAR PRODUCTS) ×2 IMPLANT
GLIDESHEATH SLEND SS 6F .021 (SHEATH) ×2 IMPLANT
GLIDESHEATH SLENDER 7FR .021G (SHEATH) ×2 IMPLANT
GUIDEWIRE INQWIRE 1.5J.035X260 (WIRE) ×1 IMPLANT
INQWIRE 1.5J .035X260CM (WIRE) ×2
KIT HEART LEFT (KITS) ×2 IMPLANT
PACK CARDIAC CATHETERIZATION (CUSTOM PROCEDURE TRAY) ×2 IMPLANT
SHEATH 6FR 75 DEST SLENDER (SHEATH) ×2 IMPLANT
SHEATH GLIDE SLENDER 4/5FR (SHEATH) ×2 IMPLANT
SHEATH PINNACLE 5F 10CM (SHEATH) ×2 IMPLANT
SHEATH PINNACLE 7F 10CM (SHEATH) ×2 IMPLANT
SHEATH PROBE COVER 6X72 (BAG) ×2 IMPLANT
TRANSDUCER W/STOPCOCK (MISCELLANEOUS) ×2 IMPLANT
TUBING CIL FLEX 10 FLL-RA (TUBING) ×2 IMPLANT
WIRE EMERALD 3MM-J .035X150CM (WIRE) ×2 IMPLANT

## 2020-09-24 NOTE — Interval H&P Note (Signed)
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  The various methods of treatment have been discussed with the patient and family. After consideration of risks, benefits and other options for treatment, the patient has consented to  Procedure(s): RIGHT/LEFT HEART CATH AND CORONARY/GRAFT ANGIOGRAPHY (N/A) as a surgical intervention.  The patient's history has been reviewed, patient examined, no change in status, stable for surgery.  I have reviewed the patient's chart and labs.  Questions were answered to the patient's satisfaction.     [name]

## 2020-09-24 NOTE — Discharge Instructions (Signed)
Radial Site Care  This sheet gives you information about how to care for yourself after your procedure. Your health care provider may also give you more specific instructions. If you have problems or questions, contact your health care provider. What can I expect after the procedure? After the procedure, it is common to have: Bruising and tenderness at the catheter insertion area. Follow these instructions at home: Medicines Take over-the-counter and prescription medicines only as told by your health care provider. Insertion site care Follow instructions from your health care provider about how to take care of your insertion site. Make sure you: Wash your hands with soap and water before you remove your bandage (dressing). If soap and water are not available, use hand sanitizer. May remove dressing in 24 hours. Check your insertion site every day for signs of infection. Check for: Redness, swelling, or pain. Fluid or blood. Pus or a bad smell. Warmth. Do no take baths, swim, or use a hot tub for 5 days. You may shower 24-48 hours after the procedure. Remove the dressing and gently wash the site with plain soap and water. Pat the area dry with a clean towel. Do not rub the site. That could cause bleeding. Do not apply powder or lotion to the site. Activity  For 24 hours after the procedure, or as directed by your health care provider: Do not flex or bend the affected arm. Do not push or pull heavy objects with the affected arm. Do not drive yourself home from the hospital or clinic. You may drive 24 hours after the procedure. Do not operate machinery or power tools. KEEP ARM ELEVATED THE REMAINDER OF THE DAY. Do not push, pull or lift anything that is heavier than 10 lb for 5 days. Ask your health care provider when it is okay to: Return to work or school. Resume usual physical activities or sports. Resume sexual activity. General instructions If the catheter site starts to  bleed, raise your arm and put firm pressure on the site. If the bleeding does not stop, get help right away. This is a medical emergency. DRINK PLENTY OF FLUIDS FOR THE NEXT 2-3 DAYS. No alcohol consumption for 24 hours after receiving sedation. If you went home on the same day as your procedure, a responsible adult should be with you for the first 24 hours after you arrive home. Keep all follow-up visits as told by your health care provider. This is important. Contact a health care provider if: You have a fever. You have redness, swelling, or yellow drainage around your insertion site. Get help right away if: You have unusual pain at the radial site. The catheter insertion area swells very fast. The insertion area is bleeding, and the bleeding does not stop when you hold steady pressure on the area. Your arm or hand becomes pale, cool, tingly, or numb. These symptoms may represent a serious problem that is an emergency. Do not wait to see if the symptoms will go away. Get medical help right away. Call your local emergency services (911 in the [place] Do not drive yourself to the hospital. Summary After the procedure, it is common to have bruising and tenderness at the site. Follow instructions from your health care provider about how to take care of your radial site wound. Check the wound every day for signs of infection.  This information is not intended to replace advice given to you by your health care provider. Make sure you discuss any questions you have with   your health care provider. Document Revised: 03/10/2017 Document Reviewed: 03/10/2017 [employer]vier Patient Education  2020 [employer]  

## 2020-09-24 NOTE — Progress Notes (Signed)
SITE AREA: right groin/femoral  SITE PRIOR TO REMOVAL:  LEVEL 0  PRESSURE APPLIED FOR: approximately 20 minutes  MANUAL: yes  PATIENT STATUS DURING PULL: stable  POST PULL SITE:  LEVEL 0  POST PULL INSTRUCTIONS GIVEN: yes  POST PULL PULSES PRESENT: pedal pulses palpable at +1  DRESSING APPLIED: gauze with tegaderm  BEDREST BEGINS @ 1036  COMMENTS:

## 2020-09-27 ENCOUNTER — Other Ambulatory Visit: Payer: Self-pay

## 2020-09-27 DIAGNOSIS — I350 Nonrheumatic aortic (valve) stenosis: Secondary | ICD-10-CM

## 2020-09-30 ENCOUNTER — Other Ambulatory Visit: Payer: Self-pay

## 2020-09-30 ENCOUNTER — Ambulatory Visit (INDEPENDENT_AMBULATORY_CARE_PROVIDER_SITE_OTHER): Payer: Medicare Other | Admitting: Cardiovascular Disease

## 2020-09-30 ENCOUNTER — Encounter: Payer: Self-pay | Admitting: Cardiovascular Disease

## 2020-09-30 VITALS — BP 110/60 | HR 81 | Ht 69.0 in | Wt 172.8 lb

## 2020-09-30 DIAGNOSIS — I25118 Atherosclerotic heart disease of native coronary artery with other forms of angina pectoris: Secondary | ICD-10-CM

## 2020-09-30 DIAGNOSIS — I350 Nonrheumatic aortic (valve) stenosis: Secondary | ICD-10-CM | POA: Diagnosis not present

## 2020-09-30 LAB — BASIC METABOLIC PANEL
BUN/Creatinine Ratio: 12 (ref 10–24)
BUN: 20 mg/dL (ref 8–27)
CO2: 18 mmol/L — ABNORMAL LOW (ref 20–29)
Calcium: 9.6 mg/dL (ref 8.6–10.2)
Chloride: 101 mmol/L (ref 96–106)
Creatinine, Ser: 1.71 mg/dL — ABNORMAL HIGH (ref 0.76–1.27)
Glucose: 187 mg/dL — ABNORMAL HIGH (ref 65–99)
Potassium: 4.2 mmol/L (ref 3.5–5.2)
Sodium: 140 mmol/L (ref 134–144)
eGFR: 40 mL/min/{1.73_m2} — ABNORMAL LOW (ref >59)

## 2020-09-30 NOTE — Progress Notes (Signed)
[facility]   MULTIDISCIPLINARY HEART VALVE TEAM  Date:  09/30/2020   ID:  Erik Hines, DOB [birth year], MRN [id number]  PCP:  Erik Partridge, PA   Chief Complaint  Patient presents with   Aortic Stenosis      HISTORY OF PRESENT ILLNESS: Erik Hines is a 80 y.o. male who presents for evaluation of aortic stenosis, referred by Dr Erik Hines.  The patient is here with his daughter today. He underwent CABG at [facility] in 2003. He remembers at the time of surgery being told that he had mild aortic valve disease with recommendation for ongoing surveillance. He has always been told of a heart murmur at the time of his regular exams. In the past he has not been aware that his valve problem has been severe. However, he was recently hospitalized with a seizure, felt to be related to the presence of a meningioma. He has done well on Keppra since hospitalization. He developed atrial fibrillation during the hospitalization and cardiology consultation was requested. The patient converted spontaneously back to sinus rhythm during the hospitalization. 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   He has recently moved to the area to be closer to his children. His wife's health has deteriorated of late as well. The patient is a former smoker but quit many years ago. He worked for [employer] and has lived many places over the years. The patient has had regular dental care in the past but none over recent years and reports that he has recently lost some teeth.  The patient ambulates with a walker because of gait instability.  States that he has not been very active over recent months.  He primarily walks around in the house and has not out very much.  He denies any chest pain, chest pressure, shortness of breath, heart palpitations, lightheadedness, orthopnea, or PND.  Past Medical History:  Diagnosis Date    Aortic stenosis    Atrial fibrillation (HCC)    CAD (coronary artery disease)    DM2 (diabetes mellitus, type 2) (HCC)    HLD (hyperlipidemia)    HTN (hypertension)    LBBB (left bundle branch block) 08/25/2020   S/P CABG (coronary artery bypass graft)     Current Outpatient Medications  Medication Sig Dispense Refill   apixaban (ELIQUIS) 5 MG TABS tablet Take 1 tablet (5 mg total) by mouth 2 (two) times daily. 60 tablet 2   atorvastatin (LIPITOR) 40 MG tablet Take 40 mg by mouth daily.     cyanocobalamin 1000 MCG tablet Take 1 tablet (1,000 mcg total) by mouth daily. 30 tablet 0   ergocalciferol (VITAMIN D2) 1.25 MG (50000 UT) capsule Take 50,000 Units by mouth once a week.     finasteride (PROSCAR) 5 MG tablet Take 5 mg by mouth daily.     insulin detemir (LEVEMIR) 100 UNIT/ML injection Inject 18 Units into the skin 2 (two) times daily.     levETIRAcetam (KEPPRA) 500 MG tablet Take 1 tablet (500 mg total) by mouth 2 (two) times daily. 60 tablet 1   lisinopril-hydrochlorothiazide (ZESTORETIC) 10-12.5 MG tablet Take 1 tablet by mouth daily.     metFORMIN (GLUCOPHAGE) 1000 MG tablet Take 1,000 mg by mouth 2 (two) times daily.     tamsulosin (FLOMAX) 0.4 MG CAPS capsule Take 0.4 mg by mouth daily.     aspirin EC 81 MG tablet Take 81 mg by mouth once. Swallow whole. (Patient  not taking: Reported on 09/30/2020)     Current Facility-Administered Medications  Medication Dose Route Frequency Provider Last Rate Last Admin   sodium chloride flush (NS) 0.9 % injection 3 mL  3 mL Intravenous Q12H Loel Dubonnet, NP        ALLERGIES:   Nsaids   SOCIAL HISTORY:  The patient  reports that he has never smoked. He has never used smokeless tobacco. He reports that he does not drink alcohol and does not use drugs.   FAMILY HISTORY:  The patient's family history is not on file.   REVIEW OF SYSTEMS:  Positive for poor balance, seizure, fatigue.   All other systems are reviewed and negative.    PHYSICAL EXAM: VS:  BP 110/60   Pulse 81   Ht '5\' 9"'$  (1.753 m)   Wt 172 lb 12.8 oz (78.4 kg)   SpO2 98%   BMI 25.52 kg/m  , BMI Body mass index is 25.52 kg/m. GEN: Well nourished, well developed, in no acute distress HEENT: normal Neck: No JVD. carotids 2+ without bruits or masses Cardiac: The heart is RRR with with a late peaking 3/6 harsh crescendo decrescendo murmur at the right upper sternal border with diminished A2.  No edema.  Respiratory:  clear to auscultation bilaterally GI: soft, nontender, nondistended, + BS MS: no deformity or atrophy Skin: warm and dry, no rash Neuro:  Strength and sensation are intact Psych: euthymic mood, full affect  EKG:  EKG from 09/17/20 reviewed and demonstrates NSR, 71 bpm, within normal limits  RECENT LABS: 08/25/2020: B Natriuretic Peptide 471.3; TSH 1.680 08/26/2020: Magnesium 1.7 08/27/2020: ALT 16 09/20/2020: BUN 15; Creatinine, Ser 1.45; Platelets 321 09/24/2020: Hemoglobin 10.9; Hemoglobin 10.5; Potassium 3.9; Potassium 3.8; Sodium 140; Sodium 141  No results found for requested labs within last 8760 hours.   Estimated Creatinine Clearance: 40 mL/min (A) (by C-G formula based on SCr of 1.45 mg/dL (H)).   Wt Readings from Last 3 Encounters:  09/30/20 172 lb 12.8 oz (78.4 kg)  09/24/20 177 lb (80.3 kg)  09/17/20 175 lb (79.4 kg)     CARDIAC STUDIES: Echo:  IMPRESSIONS     1. Left ventricular ejection fraction, by estimation, is 55 to 60%. The  left ventricle has normal function. The left ventricle has no regional  wall motion abnormalities. There is moderate asymmetric hypertrophy of the  basal septum. The rest of the LV  segments demonstrate mild left ventricular hypertrophy. Left ventricular  diastolic parameters are consistent with Grade II diastolic dysfunction  (pseudonormalization).   2. Right ventricular systolic function is low normal. The right  ventricular size is normal. There is normal pulmonary artery systolic   pressure.   3. Left atrial size was mildly dilated.   4. A small pericardial effusion is present. The pericardial effusion is  posterior to the left ventricle.   5. The mitral valve is degenerative. Mild mitral valve regurgitation.  Moderate mitral annular calcification.   6. The aortic valve is calcified. There is severe calcifcation of the  aortic valve. There is severe thickening of the aortic valve. Aortic valve  regurgitation is mild. 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 AVA  0.9cm2, mean gradient 1mHg, peak  gradient 32mg, Vmax 2.40m62m DI 0.23   7. The inferior vena cava is normal in size with greater than 50%  respiratory variability, suggesting right atrial pressure of 3 mmHg.   Comparison(s): No prior Echocardiogram.   FINDINGS   Left Ventricle: Left ventricular ejection fraction,  by estimation, is 55  to 60%. The left ventricle has normal function. The left ventricle has no  regional wall motion abnormalities. The left ventricular internal cavity  size was normal in size. There is   moderate asymmetric hypertrophy of the basal septum. The rest of the LV  segments demonstrate mild left ventricular hypertrophy. Abnormal  (paradoxical) septal motion, consistent with left bundle branch block.  Left ventricular diastolic parameters are  consistent with Grade II diastolic dysfunction (pseudonormalization).   Right Ventricle: The right ventricular size is normal. No increase in  right ventricular wall thickness. Right ventricular systolic function is  low normal. There is normal pulmonary artery systolic pressure. The  tricuspid regurgitant velocity is 2.74 m/s,   and with an assumed right atrial pressure of 3 mmHg, the estimated right  ventricular systolic pressure is 123456 mmHg.   Left Atrium: Left atrial size was mildly dilated.   Right Atrium: Right atrial size was normal in size.   Pericardium: A small pericardial effusion is present. The pericardial  effusion  is posterior to the left ventricle.   Mitral Valve: The mitral valve is degenerative in appearance. There is  mild thickening of the mitral valve leaflet(s). There is moderate  calcification of the mitral valve leaflet(s). Moderate mitral annular  calcification. Mild mitral valve regurgitation.   Tricuspid Valve: The tricuspid valve is normal in structure. Tricuspid  valve regurgitation is trivial.   Aortic Valve: AVA 0.9cm2, mean gradient 48mHg, peak gradient 315mg, Vmax  2.44m36m DI 0.23. The aortic valve is calcified. There is severe  calcifcation of the aortic valve. There is severe thickening of the aortic  valve. Aortic valve regurgitation is  mild. Aortic regurgitation PHT measures 714 msec. Moderate to severe  aortic stenosis is present. Aortic valve mean gradient measures 17.0 mmHg.  Aortic valve peak gradient measures 28.9 mmHg. Aortic valve area, by VTI  measures 0.92 cm.   Pulmonic Valve: The pulmonic valve was normal in structure. Pulmonic valve  regurgitation is trivial.   Aorta: The aortic root is normal in size and structure.   Venous: The inferior vena cava is normal in size with greater than 50%  respiratory variability, suggesting right atrial pressure of 3 mmHg.   IAS/Shunts: No atrial level shunt detected by color flow Doppler.      LEFT VENTRICLE  PLAX 2D  LVIDd:         5.00 cm  Diastology  LVIDs:         4.50 cm  LV e' medial:    3.81 cm/s  LV PW:         1.10 cm  LV E/e' medial:  28.1  LV IVS:        1.20 cm  LV e' lateral:   11.40 cm/s  LVOT diam:     2.10 cm  LV E/e' lateral: 9.4  LV SV:         62  LVOT Area:     3.46 cm      RIGHT VENTRICLE            IVC  RV Basal diam:  2.70 cm    IVC diam: 1.80 cm  RV S prime:     7.18 cm/s  TAPSE (M-mode): 1.8 cm   LEFT ATRIUM             RIGHT ATRIUM  LA diam:        5.30 cm RA Area:     14.80 cm  LA  Vol (A2C):   55.5 ml RA Volume:   33.00 ml  LA Vol (A4C):   59.6 ml  LA Biplane Vol: 58.6 ml    AORTIC VALVE  AV Area (Vmax):    0.89 cm  AV Area (Vmean):   0.88 cm  AV Area (VTI):     0.92 cm  AV Vmax:           269.00 cm/s  AV Vmean:          194.000 cm/s  AV VTI:            0.670 m  AV Peak Grad:      28.9 mmHg  AV Mean Grad:      17.0 mmHg  LVOT Vmax:         69.20 cm/s  LVOT Vmean:        49.350 cm/s  LVOT VTI:          0.178 m  LVOT/AV VTI ratio: 0.27  AI PHT:            714 msec     AORTA  Ao Root diam: 3.40 cm  Ao Asc diam:  3.00 cm   MITRAL VALVE                TRICUSPID VALVE  MV Area (PHT): 3.42 cm     TR Peak grad:   30.0 mmHg  MV Decel Time: 222 msec     TR Vmax:        274.00 cm/s  MV E velocity: 107.00 cm/s  MV A velocity: 70.70 cm/s   SHUNTS  MV E/A ratio:  1.51         Systemic VTI:  0.18 m                              Systemic Diam: 2.10 cm   Cardiac Cath:  Conclusion      Ost LM to Mid LM lesion is 50% stenosed.   Prox LAD to Mid LAD lesion is 50% stenosed with 80% stenosed side branch in 1st Diag.   Mid LAD lesion is 100% stenosed.   Ost Cx lesion is 70% stenosed.   1st Mrg-1 lesion is 99% stenosed.  1st Mrg-2 lesion is 100% stenosed.   Prox RCA to Dist RCA lesion is 100% stenosed.   ------------------GRAFTS----------------------   SVG-RCA graft was visualized by angiography and is normal in caliber. The graft exhibits no disease.   Left radial artery graft was visualized by angiography and is normal in caliber.  The graft exhibits no disease.   SVG-LAD graft was not visualized due to inability to cannulate.  Origin to Prox Graft lesion is presumed 100% stenosed.   LV end diastolic pressure is mildly elevated.   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   --------------HEMODYNAMICS----------------   SUMMARY Very tortuous innominate artery and short acing aorta makes catheter manipulation very difficult.  No graft markers. Severe Multivessel Native CAD: -> Ostial LM has appearance of at least 50% stenosis in some  images --> 100% proximal RCA and OM1 occlusion. --> 50-60% proximal LAD with 80% ostial and proximal 1st Diag with occluded LAD after 2nd Diag --> 60% ostial LCx. GRAFTS: --> Patent Free L Rad-OM1 --> Patent SVG-dRCA --> Unable to identify or engage SVG-LAD with selective catheterization as well as root aortography (consider coronary CT angiogram gated to evaluate grafts if evaluation of this graft is necessary)  Aortic valve mean gradient 23 mmHg.  (Able to cross surreptitiously with EBU guide catheter) Mild Pulmonary Hypertension-PA mean 30 mmHg with Wedge pressure of 17 mmHg   RECOMMENDATIONS Continue ongoing evaluation for pre-TAVR--if necessary, would consider evaluating SVG-LAD with Coronary CTA gated to include grafts. Okay for discharge home after 4 hours IV fluid.  Left Main  Ost LM to Mid LM lesion is 50% stenosed. The lesion is eccentric. The lesion is calcified.  Left Anterior Descending  Prox LAD to Mid LAD lesion is 50% stenosed with 80% stenosed side branch in 1st Diag. The lesion is segmental. The lesion is moderately calcified.  Mid LAD lesion is 100% stenosed. The lesion is chronically occluded.  First Diagonal Branch  Vessel is small in size.  Third Diagonal Branch  Vessel is small in size.  Left Circumflex  There is mild diffuse disease throughout the vessel.  Ost Cx lesion is 70% stenosed. Ostial  First Obtuse Marginal Branch  Vessel is large in size.  1st Mrg-1 lesion is 99% stenosed. The lesion is chronically occluded.  1st Mrg-2 lesion is 100% stenosed. The lesion is chronically occluded.  Second Obtuse Marginal Branch  Vessel is moderate in size.  Right Coronary Artery  Prox RCA to Dist RCA lesion is 100% stenosed. The lesion is chronically occluded.  Acute Marginal Branch  Vessel is small in size.  Right Ventricular Branch  Vessel is small in size.  Right Posterior Descending Artery  Vessel is small in size. There is mild disease in the vessel.  Right  Posterior Atrioventricular Artery  Vessel is moderate in size.  Saphenous Graft To Dist RCA  SVG graft was visualized by angiography and is normal in caliber. SVG-dRCA The graft exhibits no disease.  Left Radial Artery Graft To 1st Mrg  Left radial artery graft was visualized by angiography and is normal in caliber. L-Rad-OM1 The graft exhibits no disease.  Saphenous Graft To Dist LAD  SVG graft was not visualized due to inability to cannulate. SVG-LAD Unable to FIND despite use of multiple catheters & Aortic Root Angiogram. (Aortogram)  Origin to Prox Graft lesion is 100% stenosed. Presumed to be occluded - unable to engage  Intervention  No interventions have been documented. 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 PAP - mean: 41/10 mmHg, 30 mmHg PCWP 13/31 mmHg (large V wave); 17 mmHg Elevated LV EDP consistent with volume overload. LVP-EDP: 190/11 mmHg; 17 mmHg AoP-MAP: 165/69 mmHg, 108 mmHg  Right Atrium The right atrial size is normal. Right atrial pressure is normal. 7 mmHg  Right Ventricle RVP-EDP: 41/10 mmHg, 4 mmHg.   Wall Motion  No LV Gram         Left Heart  Left Ventricle LV end diastolic pressure is mildly elevated.  Aortic Valve There is moderate aortic valve stenosis.  By pressure Gradient: Mean Gradient 23 mmHg  The aortic valve is calcified.  Aorta Aortic Root:  SHORT & TORTUOUS   TORTUOUS INNOMINATE ARTERY   Coronary Diagrams   Diagnostic Dominance: Right    Intervention    Implants     No implant documentation for this case.   Syngo Images   Show images for CARDIAC CATHETERIZATION Images on Long Term Storage   Show images for Michie, Angelgabriel Krane to Procedure Log  Procedure Log    Hemo Data  Flowsheet Row Most Recent Value  Fick Cardiac Output 6.54 L/min  Fick Cardiac Output Index 3.4 (L/min)/BSA  Aortic Mean  Gradient 22.65 mmHg  Aortic Peak Gradient 26 mmHg  Aortic  Valve Area 1.30  Aortic Value Area Index 0.67 cm2/BSA  RA A Wave 4 mmHg  RA V Wave 13 mmHg  RA Mean 7 mmHg  RV Systolic Pressure 46 mmHg  RV Diastolic Pressure 0 mmHg  RV EDP 4 mmHg  PA Systolic Pressure 41 mmHg  PA Diastolic Pressure 10 mmHg  PA Mean 30 mmHg  PW A Wave 13 mmHg  PW V Wave 31 mmHg  PW Mean 17 mmHg  AO Systolic Pressure A999333 mmHg  AO Diastolic Pressure 62 mmHg  AO Mean 99991111 mmHg  LV Systolic Pressure 99991111 mmHg  LV Diastolic Pressure 8 mmHg  LV EDP 17 mmHg  AOp Systolic Pressure 123XX123 mmHg  AOp Diastolic Pressure 69 mmHg  AOp Mean Pressure 123XX123 mmHg  LVp Systolic Pressure 99991111 mmHg  LVp Diastolic Pressure 11 mmHg  LVp EDP Pressure 15 mmHg  QP/QS 1  TPVR Index 8.84 HRUI  TSVR Index 29.76 HRUI  PVR SVR Ratio 0.14  TPVR/TSVR Ratio 0.3    STS RISK CALCULATOR: Isolated AVR: Risk of Mortality: 5.104% Renal Failure: 6.113% Permanent Stroke: 3.122% Prolonged Ventilation: 13.097% DSW Infection: 0.235% Reoperation: 4.719% Morbidity or Mortality: 20.700% Short Length of Stay: 17.724% Long Length of Stay: 14.064%   ASSESSMENT AND PLAN: 48.  80 yo male with at least moderately severe, stage D3, paradoxical low-flow low gradient aortic stenosis.  The patient's 2D echo images are personally reviewed and demonstrate a calcified, restricted aortic valve with peak and mean transvalvular gradients of 33 and 18 mmHg, respectively.  The dimensionless index is 0.23 and calculated aortic valve area of 0.8 cm.  The patient's LVEF is preserved at 55 to 60%.  The stroke-volume index is reduced at 31.  The patient does not appear to have any associated symptoms of aortic stenosis at this time.  He was found to have atrial fibrillation recently but this was in the context of a seizure.  I have reviewed the natural history of aortic stenosis with the patient and their family members who are present today. We have discussed the limitations of medical therapy and the poor prognosis  associated with symptomatic aortic stenosis. We have reviewed potential treatment options, including palliative medical therapy, conventional surgical aortic valve replacement, and transcatheter aortic valve replacement. We discussed treatment options in the context of the patient's specific comorbid medical conditions.    I have recommended that we proceed with CT angiography studies of the heart as well as the chest, abdomen, and pelvis.  I think this will be helpful to better define the severity of the patient's aortic stenosis with further evaluation of aortic valve leaflet mobility and an aortic valve calcium score.  This will also evaluate anatomic feasibility of TAVR including assessment of vascular access.  It is highly likely the patient will require treatment of his aortic stenosis in the relatively near future.  However, at this point he is not having any particular symptoms and it appears his preference would be to keep a close eye on things with echo surveillance as he has just recently transitioned to the area.  He understands that his aortic stenosis will likely progress, and at a minimum we would want to follow him with a repeat echocardiogram in 6 months.  His case will be reviewed in our multidisciplinary valve meeting once his CT angiogram studies are completed.  He is counseled regarding the cardinal symptoms of aortic stenosis today.  In addition to  anatomic assessment of aortic valve disease, his CT angiogram of the heart may better define whether the saphenous vein graft to LAD is patent.  Deatra James 09/30/2020 11:28 AM     [facility] [address] [place] [address]  [phone] (office) [phone] (fax)

## 2020-09-30 NOTE — Patient Instructions (Signed)
Medication Instructions:  Your physician recommends that you continue on your current medications as directed. Please refer to the Current Medication list given to you today.  *If you need a refill on your cardiac medications before your next appointment, please call your pharmacy*   Lab Work: BMET today  If you have labs (blood work) drawn today and your tests are completely normal, you will receive your results only by: [place] (if you have MyChart) OR A paper copy in the mail If you have any lab test that is abnormal or we need to change your treatment, we will call you to review the results.   Testing/Procedures: None   Follow-Up:  The structural team will be in contact after your CTs   Other Instructions

## 2020-10-02 ENCOUNTER — Other Ambulatory Visit: Payer: Self-pay

## 2020-10-02 ENCOUNTER — Ambulatory Visit: Payer: Medicare Other | Admitting: Neurology

## 2020-10-02 DIAGNOSIS — N289 Disorder of kidney and ureter, unspecified: Secondary | ICD-10-CM

## 2020-10-02 DIAGNOSIS — I350 Nonrheumatic aortic (valve) stenosis: Secondary | ICD-10-CM

## 2020-10-04 ENCOUNTER — Ambulatory Visit (HOSPITAL_BASED_OUTPATIENT_CLINIC_OR_DEPARTMENT_OTHER): Payer: Medicare Other | Admitting: Family

## 2020-10-09 ENCOUNTER — Ambulatory Visit (HOSPITAL_COMMUNITY): Payer: Medicare Other

## 2020-10-14 ENCOUNTER — Ambulatory Visit (HOSPITAL_COMMUNITY): Payer: Medicare Other

## 2020-10-14 ENCOUNTER — Other Ambulatory Visit (HOSPITAL_COMMUNITY): Payer: Self-pay | Admitting: *Deleted

## 2020-10-15 ENCOUNTER — Ambulatory Visit (HOSPITAL_BASED_OUTPATIENT_CLINIC_OR_DEPARTMENT_OTHER)
Admission: RE | Admit: 2020-10-15 | Discharge: 2020-10-15 | Disposition: A | Discharge status: Home / Self Care | Payer: Medicare Other | Source: Ambulatory Visit | Attending: Cardiovascular Disease | Admitting: Cardiovascular Disease

## 2020-10-15 ENCOUNTER — Ambulatory Visit (HOSPITAL_COMMUNITY)
Admission: RE | Admit: 2020-10-15 | Discharge: 2020-10-15 | Disposition: A | Discharge status: Home / Self Care | Payer: Medicare Other | Source: Ambulatory Visit | Attending: Cardiovascular Disease | Admitting: Cardiovascular Disease

## 2020-10-15 DIAGNOSIS — N289 Disorder of kidney and ureter, unspecified: Secondary | ICD-10-CM

## 2020-10-15 DIAGNOSIS — I350 Nonrheumatic aortic (valve) stenosis: Secondary | ICD-10-CM | POA: Insufficient documentation

## 2020-10-15 MED ORDER — IOHEXOL 350 MG/ML SOLN
100.0000 mL | Freq: Once | INTRAVENOUS | Status: AC | PRN
  Administered 2020-10-15: 100 mL via INTRAVENOUS

## 2020-10-15 MED ORDER — SODIUM CHLORIDE 0.9 % WEIGHT BASED INFUSION: 1.0000 mL/kg/h | INTRAVENOUS | Status: DC

## 2020-10-15 MED ORDER — SODIUM CHLORIDE 0.9 % WEIGHT BASED INFUSION
3.0000 mL/kg/h | INTRAVENOUS | Status: DC
  Administered 2020-10-15: 3 mL/kg/h via INTRAVENOUS

## 2020-10-15 NOTE — Progress Notes (Signed)
Notified [place] in CT of patients first hour of IVF will be complete by 1300.

## 2020-10-16 ENCOUNTER — Telehealth: Payer: Self-pay | Admitting: Cardiovascular Disease

## 2020-10-16 DIAGNOSIS — I350 Nonrheumatic aortic (valve) stenosis: Secondary | ICD-10-CM

## 2020-10-16 DIAGNOSIS — R9389 Abnormal findings on diagnostic imaging of other specified body structures: Secondary | ICD-10-CM

## 2020-10-16 NOTE — Telephone Encounter (Signed)
Diane with [facility] called a critical report from 10/15/20 Cardiac CT.  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 Will send message to Dr. Burt Knack to address.

## 2020-10-16 NOTE — Telephone Encounter (Signed)
[facility] radiology call for  STAT

## 2020-10-16 NOTE — Telephone Encounter (Signed)
Reviewed TAVR scans completed on 8/30 and incidental finding noted.   High attenuation in the right inguinal region, largest collection of which (axial image 226 of series 4) measures 1.5 x 1.2 cm, likely to represent a pseudoaneurysm from recent vascular access.   I contacted the pt by phone and he denies any swelling or knot at his right groin site.  He denies pain at this site.  The pt states that he is doing well and does not have any issues that he is aware of at this time.

## 2020-10-17 NOTE — Telephone Encounter (Signed)
Please arrange right groin [place] - pseudoaneurysm evaluation. Not urgent

## 2020-10-17 NOTE — Telephone Encounter (Signed)
Beth travels from [place] to take the pt to appointments.  She will be in [place] on 9/8 because the pt has an appointment. I will attempt to arrange right groin [place] that day.   Ultrasound scheduled on 9/8 at 3:00 PM at the [facility].  I made Beth aware of this appointment and I left the pt a voicemail. We will plan to have an MD review the result of this test before the pt leaves the hospital.

## 2020-10-17 NOTE — Telephone Encounter (Signed)
Left message for pt's daughter, [name], to contact me in regards to arranging appointment.

## 2020-10-22 ENCOUNTER — Telehealth: Payer: Self-pay | Admitting: Cardiovascular Disease

## 2020-10-22 NOTE — Telephone Encounter (Signed)
Called patient's daughter back. Patient is wanting to know more about this knot on his groined at the incision site. Patient denies any pain or it increasing in size. Informed patient's daughter that the ultrasound on Thursday will see if there is anything of concern going on with this knot. Patient's daughter stated she did not realize the test was for his groin area and thank [place] for the call back.

## 2020-10-22 NOTE — Telephone Encounter (Signed)
Pts daughter called saying that he has a dime-sized knot close to the incision site.. pt is not in pain and has no fever or any other symptoms but would like to know if this is cause for concern and if he should be seen prior to his appt on Thursday.. please advise

## 2020-10-24 ENCOUNTER — Inpatient Hospital Stay: Payer: Medicare Other | Attending: Internal Medicine | Admitting: Internal Medicine

## 2020-10-24 ENCOUNTER — Ambulatory Visit: Payer: Medicare Other | Admitting: Neurology

## 2020-10-24 ENCOUNTER — Other Ambulatory Visit: Payer: Self-pay

## 2020-10-24 ENCOUNTER — Ambulatory Visit (HOSPITAL_COMMUNITY)
Admission: RE | Admit: 2020-10-24 | Discharge: 2020-10-24 | Disposition: A | Discharge status: Home / Self Care | Payer: Medicare Other | Source: Ambulatory Visit | Attending: Cardiovascular Disease | Admitting: Cardiovascular Disease

## 2020-10-24 VITALS — BP 155/61 | HR 79 | Temp 97.9°F | Resp 18 | Wt 168.1 lb

## 2020-10-24 DIAGNOSIS — D329 Benign neoplasm of meninges, unspecified: Secondary | ICD-10-CM | POA: Diagnosis not present

## 2020-10-24 DIAGNOSIS — I350 Nonrheumatic aortic (valve) stenosis: Secondary | ICD-10-CM | POA: Insufficient documentation

## 2020-10-24 DIAGNOSIS — R9389 Abnormal findings on diagnostic imaging of other specified body structures: Secondary | ICD-10-CM

## 2020-10-24 DIAGNOSIS — R569 Unspecified convulsions: Secondary | ICD-10-CM

## 2020-10-24 DIAGNOSIS — Z79899 Other long term (current) drug therapy: Secondary | ICD-10-CM | POA: Diagnosis not present

## 2020-10-24 DIAGNOSIS — Z952 Presence of prosthetic heart valve: Secondary | ICD-10-CM | POA: Diagnosis not present

## 2020-10-24 MED ORDER — LEVETIRACETAM 500 MG PO TABS: 500.0000 mg | ORAL_TABLET | Freq: Two times a day (BID) | ORAL | 5 refills | Status: AC

## 2020-10-24 NOTE — Progress Notes (Signed)
[facility] [place], [place] [address] [phone]   New Patient Evaluation  Date of Service: 10/24/20 Patient Name: Erik Hines Patient MRN: [id number] Patient DOB: 1940-10-22 Provider: Ventura Sellers, MD  Identifying Statement:  Erik Hines is a 80 y.o. male with right parietal meningioma who presents for initial consultation and evaluation.    Referring Provider: Pieter Partridge, [place] [address] [place],  [place] [address]  Oncologic History 08/25/20: Presents with first ever seizure, MRI demonstrates R parietal dural based mass  History of Present Illness: The patient's records from the referring physician were obtained and reviewed and the patient interviewed to confirm this HPI.  Erik Hines presented to medical/neurological attention on 08/25/20, after first ever seizure.  He was "found down, shaking" by family member.  After the event, he was confused and drowsy for several hours.  CNS imaging obtained demonstrated R parietal mass consistent with likely meningioma.  Neurosurgery declined to resect the mass during the admission.  He was started on Keppra, which he continues on without further seizure activity.  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  Medications: Current Outpatient Medications on File Prior to Visit  Medication Sig Dispense Refill   apixaban (ELIQUIS) 5 MG TABS tablet Take 1 tablet (5 mg total) by mouth 2 (two) times daily. 60 tablet 2   aspirin EC 81 MG tablet Take 81 mg by mouth once. Swallow whole. (Patient not taking: Reported on 09/30/2020)     atorvastatin (LIPITOR) 40 MG tablet Take 40 mg by mouth daily.     cyanocobalamin 1000 MCG tablet Take 1 tablet (1,000 mcg total) by mouth daily. 30 tablet 0   ergocalciferol (VITAMIN D2) 1.25 MG (50000 UT) capsule Take 50,000 Units by mouth once a week.     finasteride (PROSCAR) 5 MG tablet Take 5 mg by mouth  daily.     insulin detemir (LEVEMIR) 100 UNIT/ML injection Inject 18 Units into the skin 2 (two) times daily.     levETIRAcetam (KEPPRA) 500 MG tablet Take 1 tablet (500 mg total) by mouth 2 (two) times daily. 60 tablet 1   lisinopril-hydrochlorothiazide (ZESTORETIC) 10-12.5 MG tablet Take 1 tablet by mouth daily.     metFORMIN (GLUCOPHAGE) 1000 MG tablet Take 1,000 mg by mouth 2 (two) times daily.     tamsulosin (FLOMAX) 0.4 MG CAPS capsule Take 0.4 mg by mouth daily.     Current Facility-Administered Medications on File Prior to Visit  Medication Dose Route Frequency Provider Last Rate Last Admin   sodium chloride flush (NS) 0.9 % injection 3 mL  3 mL Intravenous Q12H Loel Dubonnet, NP        Allergies:  Allergies  Allergen Reactions   Nsaids Other (See Comments)    Elevated Cr.   Past Medical History:  Past Medical History:  Diagnosis Date   Aortic stenosis    Atrial fibrillation (HCC)    CAD (coronary artery disease)    DM2 (diabetes mellitus, type 2) (HCC)    HLD (hyperlipidemia)    HTN (hypertension)    LBBB (left bundle branch block) 08/25/2020   S/P CABG (coronary artery bypass graft)    Past Surgical History:  Past Surgical History:  Procedure Laterality Date   CORONARY ARTERY BYPASS GRAFT     RIGHT/LEFT HEART CATH AND CORONARY/GRAFT ANGIOGRAPHY N/A 09/24/2020   Procedure: RIGHT/LEFT HEART CATH AND CORONARY/GRAFT ANGIOGRAPHY;  Surgeon: Leonie Man, MD;  Location: [facility];  Service: Cardiovascular;  Laterality: N/A;   Social History:  Social History   Socioeconomic History   Marital status: Married    Spouse name: Not on file   Number of children: Not on file   Years of education: Not on file   Highest education level: Not on file  Occupational History   Not on file  Tobacco Use   Smoking status: Never   Smokeless tobacco: Never  Vaping Use   Vaping Use: Never used  Substance and Sexual Activity   Alcohol use: Never   Drug use: Never    Sexual activity: Not on file  Other Topics Concern   Not on file  Social History Narrative   Not on file   Social Determinants of Health   Financial Resource Strain: Not on file  Food Insecurity: Not on file  Transportation Needs: Not on file  Physical Activity: Not on file  Stress: Not on file  Social Connections: Not on file  Intimate Partner Violence: Not on file   Family History: No family history on file.  Review of Systems: Constitutional: Doesn't report fevers, chills or abnormal weight loss Eyes: Doesn't report blurriness of vision Ears, nose, mouth, throat, and face: Doesn't report sore throat Respiratory: Doesn't report cough, dyspnea or wheezes Cardiovascular: Doesn't report palpitation, chest discomfort  Gastrointestinal:  Doesn't report nausea, constipation, diarrhea GU: Doesn't report incontinence Skin: Doesn't report skin rashes Neurological: Per HPI Musculoskeletal: Doesn't report joint pain Behavioral/Psych: Doesn't report anxiety  Physical Exam: Vitals:   10/24/20 1111  BP: (!) 155/61  Pulse: 79  Resp: 18  Temp: 97.9 F (36.6 C)  SpO2: 97%   KPS: 90. General: Alert, cooperative, pleasant, in no acute distress Head: Normal EENT: No conjunctival injection or scleral icterus.  Lungs: Resp effort normal Cardiac: Regular rate Abdomen: Non-distended abdomen Skin: No rashes cyanosis or petechiae. Extremities: No clubbing or edema  Neurologic Exam: Mental Status: Awake, alert, attentive to examiner. Oriented to self and environment. Language is fluent with intact comprehension.  Cranial Nerves: Visual acuity is grossly normal. Visual fields are full. Extra-ocular movements intact. No ptosis. Face is symmetric Motor: Tone and bulk are normal. Power is full in both arms and legs. Reflexes are symmetric, no pathologic reflexes present.  Sensory: Intact to light touch Gait: Normal.   Labs: I have reviewed the data as listed    Component Value  Date/Time   NA 140 09/30/2020 1130   K 4.2 09/30/2020 1130   CL 101 09/30/2020 1130   CO2 18 (L) 09/30/2020 1130   GLUCOSE 187 (H) 09/30/2020 1130   GLUCOSE 132 (H) 08/27/2020 0247   BUN 20 09/30/2020 1130   CREATININE 1.71 (H) 09/30/2020 1130   CALCIUM 9.6 09/30/2020 1130   PROT 6.2 (L) 08/27/2020 0247   ALBUMIN 3.4 (L) 08/27/2020 0247   AST 39 08/27/2020 0247   ALT 16 08/27/2020 0247   ALKPHOS 61 08/27/2020 0247   BILITOT 1.2 08/27/2020 0247   GFRNONAA 50 (L) 08/27/2020 0247   Lab Results  Component Value Date   WBC 11.1 (H) 09/20/2020   NEUTROABS 15.4 (H) 08/25/2020   HGB 10.9 (L) 09/24/2020   HGB 10.5 (L) 09/24/2020   HCT 32.0 (L) 09/24/2020   HCT 31.0 (L) 09/24/2020   MCV 88 09/20/2020   PLT 321 09/20/2020    Imaging: CLINICAL DATA:  Seizure, nontraumatic.   EXAM: MRI HEAD WITHOUT AND WITH CONTRAST   TECHNIQUE: Multiplanar, multiecho pulse sequences of  the brain and surrounding structures were obtained without and with intravenous contrast.   CONTRAST:  81m GADAVIST GADOBUTROL 1 MMOL/ML IV SOLN   COMPARISON:  Head CT 08/25/2020.   FINDINGS: Brain:   Moderate generalized cerebral atrophy. Commensurate prominence of the ventricles and sulci. Comparatively mild cerebellar atrophy.   2.0 x 2.0 x 2.0 cm enhancing mass overlying the right parietal lobe, abutting the posterior aspect of the superior sagittal sinus, likely reflecting a meningioma (series 10, image 41) (series 11, image 5). Local mass effect upon the a underlying right parietal lobe with mild underlying parenchymal edema (series 6, image 25). No ventricular effacement or midline shift.   Mild multifocal T2/FLAIR hyperintensity within the cerebral white matter, nonspecific but compatible with chronic small vessel ischemic disease.   T2/FLAIR sequences oriented to the perpendicular axis of the hippocampi were not acquired.   There is no acute infarct.   No chronic intracranial blood  products.   No extra-axial fluid collection.   No midline shift.   Partially empty sella turcica.   Vascular: Expected proximal arterial flow voids. Non dominant intracranial left vertebral artery.   Skull and upper cervical spine: Normal marrow signal.   Sinuses/Orbits: Visualized orbits show no acute finding. Complete opacification of the right maxillary sinus. No more than mild paranasal sinus mucosal thickening elsewhere.   Other: Small right mastoid effusion.   IMPRESSION: 2.0 x 2.0 enhancing extra-axial mass overlying the right parietal lobe, most compatible with meningioma. Local mass effect upon the underlying right parietal lobe with mild underlying parenchymal edema. The mass abuts the posterior aspect of the superior sagittal sinus.   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   Moderate generalized cerebral atrophy. Comparatively mild cerebellar atrophy.   Paranasal sinus disease, most notably severe right maxillary sinusitis.   Small right mastoid effusion.     Electronically Signed   By: KKellie SimmeringDO   On: 08/26/2020 08:30   Assessment/Plan Focal seizure ([place]  Meningioma ([facility]  We appreciate the opportunity to participate in the care of [employer]  He presents today with clinical syndrome consistent with focal seizure.  Etiology is dural based mass, cortical compression within R parietal lobe consistent with likely meningioma.   Apart from the one seizure, tumor is not causing clinical/neurologic symptoms at this time.    We will recommend repeating MRI study in 6 months to assess growth rate, defer surgery for now.  He is agreeable with this plan.  He will call [place] further seizures, new or progressive complaints.  Screening for potential clinical trials was performed and discussed using eligibility criteria for active protocols [facility], as well as national database  available on [email or web address]    The patient is not a candidate for a research protocol at this time due to no suitable study identified.   We spent twenty additional minutes teaching regarding the natural history, biology, and historical experience in the treatment of brain tumors. We then discussed in detail the current recommendations for therapy focusing on the mode of administration, mechanism of action, anticipated toxicities, and quality of life issues associated with this plan. We also provided teaching sheets for the patient to take home as an additional resource.  All questions were answered. The patient knows to call the clinic with any problems, questions or concerns. No barriers to learning were detected.  The total time spent in the encounter was 45 minutes and more than 50% was on counseling  and review of test results   Ventura Sellers, MD Medical Director of Neuro-Oncology [facility] [place] 10/24/20 11:19 AM

## 2020-10-24 NOTE — Progress Notes (Signed)
Pseudo check has been completed.   Preliminary results in CV Proc.   Erik Hines 10/24/2020 3:41 PM

## 2020-10-25 ENCOUNTER — Telehealth: Payer: Self-pay | Admitting: Cardiovascular Disease

## 2020-10-25 NOTE — Telephone Encounter (Signed)
New Message:     Daughter wants to know if patient is supposed to be taking Metformin? Patient said he was called and told not to take it.

## 2020-10-25 NOTE — Telephone Encounter (Signed)
Spoke with daughter ok per DPR.  She wants to know if Dr. Burt Knack told pt to stop taking metformin.  I advised her that Dr. Burt Knack would not stop metformin.  I advised her that sometimes prior to CT scans pt maybe asked to hold medication for a few days.  But, we do not discontinue metformin.  She expressed that she feels dad may be confused; as she called PCP office also and they denied telling pt to stop med. I advised her to call back with any further questions or concerns.

## 2020-10-28 ENCOUNTER — Other Ambulatory Visit: Payer: Self-pay | Admitting: *Deleted

## 2020-10-28 DIAGNOSIS — R7989 Other specified abnormal findings of blood chemistry: Secondary | ICD-10-CM

## 2020-10-28 DIAGNOSIS — I350 Nonrheumatic aortic (valve) stenosis: Secondary | ICD-10-CM

## 2020-10-28 NOTE — Progress Notes (Signed)
BMP ordered

## 2020-11-05 ENCOUNTER — Telehealth: Payer: Self-pay | Admitting: Internal Medicine

## 2020-11-05 ENCOUNTER — Other Ambulatory Visit: Payer: Self-pay

## 2020-11-05 ENCOUNTER — Other Ambulatory Visit: Payer: Medicare Other

## 2020-11-05 DIAGNOSIS — R7989 Other specified abnormal findings of blood chemistry: Secondary | ICD-10-CM

## 2020-11-05 DIAGNOSIS — I350 Nonrheumatic aortic (valve) stenosis: Secondary | ICD-10-CM

## 2020-11-05 LAB — BASIC METABOLIC PANEL
BUN/Creatinine Ratio: 19 (ref 10–24)
BUN: 26 mg/dL (ref 8–27)
CO2: 16 mmol/L — ABNORMAL LOW (ref 20–29)
Calcium: 9.5 mg/dL (ref 8.6–10.2)
Chloride: 101 mmol/L (ref 96–106)
Creatinine, Ser: 1.37 mg/dL — ABNORMAL HIGH (ref 0.76–1.27)
Glucose: 281 mg/dL — ABNORMAL HIGH (ref 65–99)
Potassium: 4.9 mmol/L (ref 3.5–5.2)
Sodium: 138 mmol/L (ref 134–144)
eGFR: 52 mL/min/{1.73_m2} — ABNORMAL LOW (ref >59)

## 2020-11-05 NOTE — Telephone Encounter (Signed)
Sch per 9/8 los,pt daughter aware.

## 2020-12-02 ENCOUNTER — Encounter (INDEPENDENT_AMBULATORY_CARE_PROVIDER_SITE_OTHER): Payer: Self-pay | Admitting: Ophthalmology

## 2020-12-02 ENCOUNTER — Ambulatory Visit (INDEPENDENT_AMBULATORY_CARE_PROVIDER_SITE_OTHER): Payer: Medicare Other | Admitting: Ophthalmology

## 2020-12-02 ENCOUNTER — Other Ambulatory Visit: Payer: Self-pay

## 2020-12-02 ENCOUNTER — Encounter (INDEPENDENT_AMBULATORY_CARE_PROVIDER_SITE_OTHER): Payer: Medicare Other | Admitting: Ophthalmology

## 2020-12-02 DIAGNOSIS — H353132 Nonexudative age-related macular degeneration, bilateral, intermediate dry stage: Secondary | ICD-10-CM | POA: Diagnosis not present

## 2020-12-02 DIAGNOSIS — H2513 Age-related nuclear cataract, bilateral: Secondary | ICD-10-CM

## 2020-12-02 DIAGNOSIS — H35372 Puckering of macula, left eye: Secondary | ICD-10-CM

## 2020-12-02 DIAGNOSIS — I25118 Atherosclerotic heart disease of native coronary artery with other forms of angina pectoris: Secondary | ICD-10-CM | POA: Diagnosis not present

## 2020-12-02 DIAGNOSIS — H35371 Puckering of macula, right eye: Secondary | ICD-10-CM | POA: Diagnosis not present

## 2020-12-02 HISTORY — DX: Age-related nuclear cataract, bilateral: H25.13

## 2020-12-02 NOTE — Assessment & Plan Note (Signed)
Minor OD not likely to impact acuity

## 2020-12-02 NOTE — Assessment & Plan Note (Signed)
OS moderate to severe likely impact on acuity yet needs cataract surgery left eye first

## 2020-12-02 NOTE — Progress Notes (Signed)
12/02/2020     CHIEF COMPLAINT Patient presents for  Chief Complaint  Patient presents with   Retina Evaluation      HISTORY OF PRESENT ILLNESS: Erik Hines is a 80 y.o. male who presents to the clinic today for:   HPI     Retina Evaluation   In both eyes.  This started 1 year ago.  Duration of 1 year.        Comments   NP decreased vision OU, OCT changes ref by Dr. Gershon Crane.  Pt c/o decreased vision OD>OS, over the past year. Pt c/o having difficulty reading fine print. Pt states if he closes his right eye he can see much better. Pt denies any flashes or floaters. Pt denies any eye pain. 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 Pt c/o having episodes of binocular diplopia from time to time.   Type 2 Diabetes. Diagnosed in 1995. A1c: around 8  EyeMeds: None      Last edited by Reather Littler, COA on 12/02/2020  2:20 PM.      Referring physician: Rutherford Guys, [place],  [place] [address]  HISTORICAL INFORMATION:   Selected notes from the MEDICAL RECORD NUMBER    Lab Results  Component Value Date   HGBA1C 8.4 (H) 08/26/2020     CURRENT MEDICATIONS: No current outpatient medications on file. (Ophthalmic Drugs)   No current facility-administered medications for this visit. (Ophthalmic Drugs)   Current Outpatient Medications (Other)  Medication Sig   apixaban (ELIQUIS) 5 MG TABS tablet Take 1 tablet (5 mg total) by mouth 2 (two) times daily.   atorvastatin (LIPITOR) 40 MG tablet Take 40 mg by mouth daily.   cyanocobalamin 1000 MCG tablet Take 1 tablet (1,000 mcg total) by mouth daily.   ergocalciferol (VITAMIN D2) 1.25 MG (50000 UT) capsule Take 50,000 Units by mouth once a week.   finasteride (PROSCAR) 5 MG tablet Take 5 mg by mouth daily.   insulin detemir (LEVEMIR) 100 UNIT/ML injection Inject 18 Units into the skin 2 (two) times daily.   levETIRAcetam (KEPPRA) 500 MG tablet  Take 1 tablet (500 mg total) by mouth 2 (two) times daily.   lisinopril-hydrochlorothiazide (ZESTORETIC) 10-12.5 MG tablet Take 1 tablet by mouth daily.   metFORMIN (GLUCOPHAGE) 1000 MG tablet Take 1,000 mg by mouth 2 (two) times daily. (Patient not taking: Reported on 10/24/2020)   tamsulosin (FLOMAX) 0.4 MG CAPS capsule Take 0.4 mg by mouth daily.   Current Facility-Administered Medications (Other)  Medication Route   sodium chloride flush (NS) 0.9 % injection 3 mL Intravenous      REVIEW OF SYSTEMS:    ALLERGIES Allergies  Allergen Reactions   Nsaids Other (See Comments)    Elevated Cr.    PAST MEDICAL HISTORY Past Medical History:  Diagnosis Date   Aortic stenosis    Atrial fibrillation (HCC)    CAD (coronary artery disease)    DM2 (diabetes mellitus, type 2) (HCC)    HLD (hyperlipidemia)    HTN (hypertension)    LBBB (left bundle branch block) 08/25/2020   S/P CABG (coronary artery bypass graft)    Past Surgical History:  Procedure Laterality Date   CORONARY ARTERY BYPASS GRAFT     RIGHT/LEFT HEART CATH AND CORONARY/GRAFT ANGIOGRAPHY N/A 09/24/2020   Procedure: RIGHT/LEFT HEART CATH AND CORONARY/GRAFT ANGIOGRAPHY;  Surgeon: Leonie Man, MD;  Location: [facility];  Service:  Cardiovascular;  Laterality: N/A;    FAMILY HISTORY History reviewed. No pertinent family history.  SOCIAL HISTORY Social History   Tobacco Use   Smoking status: Never   Smokeless tobacco: Never  Vaping Use   Vaping Use: Never used  Substance Use Topics   Alcohol use: Never   Drug use: Never         OPHTHALMIC EXAM:  Base Eye Exam     Visual Acuity (ETDRS)       Right Left   Dist cc 20/100 +2 20/70 +1   Dist ph cc NI NI    Correction: Glasses         Tonometry (Tonopen, 2:28 PM)       Right Left   Pressure 14 15         Pupils       Pupils Dark Light Shape React APD   Right PERRL 3 2 Round Brisk None   Left PERRL 3 2 Round Brisk None          Visual Fields (Counting fingers)       Left Right    Full Full         Extraocular Movement       Right Left    Full, Ortho Full, Ortho         Neuro/Psych     Oriented x3: Yes   Mood/Affect: Normal         Dilation     Both eyes: 1.0% Mydriacyl, 2.5% Phenylephrine @ 2:27 PM           Slit Lamp and Fundus Exam     External Exam       Right Left   External Normal Normal         Slit Lamp Exam       Right Left   Lids/Lashes Normal Normal   Conjunctiva/Sclera White and quiet White and quiet   Cornea Clear Clear   Anterior Chamber Deep and quiet Deep and quiet   Iris Round and reactive Round and reactive   Lens 3+ Nuclear sclerosis 2+ Nuclear sclerosis   Anterior Vitreous Normal Normal         Fundus Exam       Right Left   Posterior Vitreous Normal Normal   Disc Peripapillary atrophy Peripapillary atrophy   C/D Ratio 0.5 0.5   Macula Early age related macular degeneration, Hard drusen Early age related macular degeneration, Hard drusen   Vessels Normal Normal   Periphery Normal Normal            IMAGING AND PROCEDURES  Imaging and Procedures for 12/02/20  OCT, Retina - OU - Both Eyes       Right Eye Quality was good. Scan locations included subfoveal. Central Foveal Thickness: 328. Progression has no prior data. Findings include myopic contour, subretinal hyper-reflective material, retinal drusen , epiretinal membrane.   Left Eye Quality was good. Scan locations included subfoveal. Central Foveal Thickness: 364. Progression has no prior data. Findings include abnormal foveal contour, epiretinal membrane, myopic contour.   Notes OD Minor epiretinal membrane nasal to FAZ.  OS with Moderate epiretinal membrane with thickening of the fovea and elevation of the foveal depression             ASSESSMENT/PLAN:  Cataract, nuclear sclerotic, both eyes The nature of cataract was discussed with the patient as well as the elective  nature of surgery. The patient was reassured that surgery at a later date  does not put the patient at risk for a worse outcome. It was emphasized that the need for surgery is dictated by the patient's quality of life as influenced by the cataract. Patient was instructed to maintain close follow up with their general eye care doctor.   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  Each eye with medial opacity demonstrable by OCT evaluation.  I do believe that there are multifactorial visual acuity impact from the combination of cataract macular degeneration and epiretinal membrane.  OD I believe the cataract is sufficient to explain 2018 vision.  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  Moreover the right eye has a small epiretinal membrane on the nasal aspect of the fovea which probably has no impact on acuity in the right eye at this time.  Therefore I would suggest consideration of cataract extraction with intraocular placement in the right eye first to maximize visual potential.  Reevaluation would be appropriate thereafter  The left eye, has less extensive but still visually significant cataract.  The left eye has more extensive epiretinal membrane that is a twisting of the retina, this has a potential for visual acuity impact as well.  I have explained to the patient the cataract surgery and intraocular lens implant in the left eye would be necessary first then reevaluation of the of the improvement in acuity and if the residual epiretinal membrane is still hampering acuity, a revisit here would allow [place] to reevaluate whether surgical intervention for the wrinkling of the retina would be appropriate.  Intermediate stage nonexudative age-related macular degeneration of both eyes The nature of dry age related macular degeneration was discussed with the patient as well as its possible conversion to wet.  The results of the AREDS 2 study was discussed with the patient. A diet rich in dark leafy green vegetables was advised and specific recommendations were made regarding supplements with AREDS 2 formulation . Control of hypertension and serum cholesterol may slow the disease. Smoking cessation is mandatory to slow the disease and diminish the risk of progressing to wet age related macular degeneration. The patient was instructed in the use of an [place] and was told to return immediately for any changes in the Grid. Stressed to the patient do not rub eyes  Macular pucker, left eye OS moderate to severe likely impact on acuity yet needs cataract surgery left eye first  Macular puckering, right eye Minor OD not likely to impact acuity     ICD-10-CM   1. Macular pucker, left eye  H35.372 OCT, Retina - OU - Both Eyes    2. Macular puckering, right eye  H35.371 OCT, Retina - OU - Both Eyes    3. Intermediate stage nonexudative age-related macular degeneration of both eyes  H35.3132 OCT, Retina - OU - Both Eyes    4. Cataract, nuclear sclerotic, both eyes  H25.13       1.  Epiretinal membrane left eye likely has some impact on acuity, however cataract surgery in the left eye would be appropriate first and then a reassessment of the visual acuity impact and if residual acuity difficulties exist consideration of vitrectomy left eye could be undertaken  2. OD with more extensive cataract in my opinion and my examination And would require cataract surgery with intraocular lens placement to maximize visual potential.  Subfoveal drusen from Intermediate ARMD however is not an operable or treatable condition   3.I recommend consideration of  cataract surgery with intraocular lens placement in the right eye first   Follow-up with Dr. Rutherford Guys and consideration of cataract surgery with intraocular lens placement in the right eye for  Ophthalmic Meds Ordered this visit:  No orders of the defined  types were placed in this encounter.      Return in about 6 weeks (around 01/13/2021) for DILATE OU, OCT.  There are no Patient Instructions on file for this visit.   Explained the diagnoses, plan, and follow up with the patient and they expressed understanding.  Patient expressed understanding of the importance of proper follow up care.   Clent Demark Glenis Musolf M.D. Diseases & Surgery of the Retina and Vitreous [facility] [place] 12/02/20     Abbreviations: M myopia (nearsighted); A astigmatism; H hyperopia (farsighted); P presbyopia; Mrx spectacle prescription;  CTL contact lenses; OD right eye; OS left eye; OU both eyes  XT exotropia; ET esotropia; PEK punctate epithelial keratitis; PEE punctate epithelial erosions; DES dry eye syndrome; MGD meibomian gland dysfunction; ATs artificial tears; PFAT's preservative free artificial tears; [place] nuclear sclerotic cataract; PSC posterior subcapsular cataract; ERM epi-retinal membrane; PVD posterior vitreous detachment; RD retinal detachment; DM diabetes mellitus; DR diabetic retinopathy; NPDR non-proliferative diabetic retinopathy; PDR proliferative diabetic retinopathy; CSME clinically significant macular edema; DME diabetic macular edema; dbh dot blot hemorrhages; CWS cotton wool spot; POAG primary open angle glaucoma; C/D cup-to-disc ratio; HVF humphrey visual field; GVF goldmann visual field; OCT optical coherence tomography; IOP intraocular pressure; BRVO Branch retinal vein occlusion; CRVO central retinal vein occlusion; CRAO central retinal artery occlusion; BRAO branch retinal artery occlusion; RT retinal tear; SB scleral buckle; PPV pars plana vitrectomy; VH Vitreous hemorrhage; PRP panretinal laser photocoagulation; IVK intravitreal kenalog; VMT vitreomacular traction; MH Macular hole;  NVD neovascularization of the disc; NVE neovascularization elsewhere; AREDS age related eye disease study; ARMD age related macular degeneration; POAG  primary open angle glaucoma; EBMD epithelial/anterior basement membrane dystrophy; ACIOL anterior chamber intraocular lens; IOL intraocular lens; PCIOL posterior chamber intraocular lens; Phaco/IOL phacoemulsification with intraocular lens placement; [place] photorefractive keratectomy; LASIK laser assisted in situ keratomileusis; HTN hypertension; DM diabetes mellitus; COPD chronic obstructive pulmonary disease

## 2020-12-02 NOTE — Assessment & Plan Note (Signed)
The nature of cataract was discussed with the patient as well as the elective nature of surgery. The patient was reassured that surgery at a later date does not put the patient at risk for a worse outcome. It was emphasized that the need for surgery is dictated by the patient's quality of life as influenced by the cataract. Patient was instructed to maintain close follow up with their general eye care doctor.   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  Each eye with medial opacity demonstrable by OCT evaluation.  I do believe that there are multifactorial visual acuity impact from the combination of cataract macular degeneration and epiretinal membrane.  OD I believe the cataract is sufficient to explain 2018 vision.  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  Moreover the right eye has a small epiretinal membrane on the nasal aspect of the fovea which probably has no impact on acuity in the right eye at this time.  Therefore I would suggest consideration of cataract extraction with intraocular placement in the right eye first to maximize visual potential.  Reevaluation would be appropriate thereafter  The left eye, has less extensive but still visually significant cataract.  The left eye has more extensive epiretinal membrane that is a twisting of the retina, this has a potential for visual acuity impact as well.  I have explained to the patient the cataract surgery and intraocular lens implant in the left eye would be necessary first then reevaluation of the of the improvement in acuity and if the residual epiretinal membrane is still hampering acuity, a revisit here would allow [place] to reevaluate whether surgical intervention for the wrinkling of the retina would be appropriate.

## 2020-12-02 NOTE — Assessment & Plan Note (Signed)

## 2020-12-24 ENCOUNTER — Ambulatory Visit (INDEPENDENT_AMBULATORY_CARE_PROVIDER_SITE_OTHER): Payer: Medicare Other | Admitting: Podiatry

## 2020-12-24 ENCOUNTER — Other Ambulatory Visit: Payer: Self-pay

## 2020-12-24 DIAGNOSIS — E119 Type 2 diabetes mellitus without complications: Secondary | ICD-10-CM

## 2020-12-24 DIAGNOSIS — M79674 Pain in right toe(s): Secondary | ICD-10-CM

## 2020-12-24 DIAGNOSIS — B351 Tinea unguium: Secondary | ICD-10-CM | POA: Diagnosis not present

## 2020-12-24 DIAGNOSIS — M79675 Pain in left toe(s): Secondary | ICD-10-CM

## 2020-12-25 NOTE — Progress Notes (Signed)
  Subjective:  Patient ID: Erik Hines, male    DOB: [birth year],  MRN: [id number]  Chief Complaint  Patient presents with   Nail Problem    Thick painful toenails, 3 month follow up    80 y.o. male presents with the above complaint. History confirmed with patient.  Doing well, here with his daughter again.  His blood sugar continues to be well controlled.  No new issues.  Objective:  Physical Exam: warm, good capillary refill, no trophic changes or ulcerative lesions, normal DP and PT pulses, normal monofilament exam, and normal sensory exam. Left Foot: dystrophic yellowed discolored nail plates with subungual debris Right Foot: dystrophic yellowed discolored nail plates with subungual debris  Assessment:   1. Pain due to onychomycosis of toenails of both feet   2. Type 2 diabetes mellitus without complication, without long-term current use of insulin ([place])      Plan:  Patient was evaluated and treated and all questions answered.  Patient educated on diabetes. Discussed proper diabetic foot care and discussed risks and complications of disease. Educated patient in depth on reasons to return to the office immediately should he/she discover anything concerning or new on the feet. All questions answered. Discussed proper shoes as well.   Discussed the etiology and treatment options for the condition in detail with the patient. Educated patient on the topical and oral treatment options for mycotic nails. Recommended debridement of the nails today. Sharp and mechanical debridement performed of all painful and mycotic nails today. Nails debrided in length and thickness using a nail nipper to level of comfort. Discussed treatment options including appropriate shoe gear. Follow up as needed for painful nails.    Return in about 3 months (around 03/26/2021) for at risk diabetic foot care.

## 2021-01-14 ENCOUNTER — Encounter (INDEPENDENT_AMBULATORY_CARE_PROVIDER_SITE_OTHER): Payer: Medicare Other | Admitting: Ophthalmology

## 2021-01-28 ENCOUNTER — Encounter (INDEPENDENT_AMBULATORY_CARE_PROVIDER_SITE_OTHER): Payer: Medicare Other | Admitting: Ophthalmology

## 2021-02-13 ENCOUNTER — Ambulatory Visit (INDEPENDENT_AMBULATORY_CARE_PROVIDER_SITE_OTHER): Payer: Medicare Other | Admitting: Ophthalmology

## 2021-02-13 ENCOUNTER — Encounter (INDEPENDENT_AMBULATORY_CARE_PROVIDER_SITE_OTHER): Payer: Self-pay | Admitting: Ophthalmology

## 2021-02-13 ENCOUNTER — Other Ambulatory Visit: Payer: Self-pay

## 2021-02-13 DIAGNOSIS — H35371 Puckering of macula, right eye: Secondary | ICD-10-CM

## 2021-02-13 DIAGNOSIS — I25118 Atherosclerotic heart disease of native coronary artery with other forms of angina pectoris: Secondary | ICD-10-CM | POA: Diagnosis not present

## 2021-02-13 DIAGNOSIS — E119 Type 2 diabetes mellitus without complications: Secondary | ICD-10-CM

## 2021-02-13 DIAGNOSIS — H2513 Age-related nuclear cataract, bilateral: Secondary | ICD-10-CM | POA: Diagnosis not present

## 2021-02-13 DIAGNOSIS — H353132 Nonexudative age-related macular degeneration, bilateral, intermediate dry stage: Secondary | ICD-10-CM | POA: Diagnosis not present

## 2021-02-13 DIAGNOSIS — Z961 Presence of intraocular lens: Secondary | ICD-10-CM

## 2021-02-13 DIAGNOSIS — H35372 Puckering of macula, left eye: Secondary | ICD-10-CM | POA: Diagnosis not present

## 2021-02-13 NOTE — Assessment & Plan Note (Signed)
No sign of CNVM 

## 2021-02-13 NOTE — Progress Notes (Signed)
02/13/2021     CHIEF COMPLAINT Patient presents for  Chief Complaint  Patient presents with   Retina Follow Up      HISTORY OF PRESENT ILLNESS: Erik Hines is a 80 y.o. male who presents to the clinic today for:   HPI     Retina Follow Up           Diagnosis: Other   Laterality: left eye   Onset: 10 weeks ago   Severity: mild   Duration: 10 weeks   Course: stable         Comments   10 week fu ou and OCT  Pt had CEIOL OD on 01/22/2021 and CEIOL OS and 02/05/2021 with Dr. Gershon Crane. Pt reports that he is still using surgical gtts in both eyes.  Pt states, "My vision is outstanding now. It really went well."      Last edited by Kendra Opitz, COA on 02/13/2021  2:04 PM.      Referring physician: Rutherford Guys, [place],  [place] [address]  HISTORICAL INFORMATION:   Selected notes from the MEDICAL RECORD NUMBER    Lab Results  Component Value Date   HGBA1C 8.4 (H) 08/26/2020     CURRENT MEDICATIONS: No current outpatient medications on file. (Ophthalmic Drugs)   No current facility-administered medications for this visit. (Ophthalmic Drugs)   Current Outpatient Medications (Other)  Medication Sig   apixaban (ELIQUIS) 5 MG TABS tablet Take 1 tablet (5 mg total) by mouth 2 (two) times daily.   atorvastatin (LIPITOR) 40 MG tablet Take 40 mg by mouth daily.   cyanocobalamin 1000 MCG tablet Take 1 tablet (1,000 mcg total) by mouth daily.   ergocalciferol (VITAMIN D2) 1.25 MG (50000 UT) capsule Take 50,000 Units by mouth once a week.   finasteride (PROSCAR) 5 MG tablet Take 5 mg by mouth daily.   insulin detemir (LEVEMIR) 100 UNIT/ML injection Inject 18 Units into the skin 2 (two) times daily.   levETIRAcetam (KEPPRA) 500 MG tablet Take 1 tablet (500 mg total) by mouth 2 (two) times daily.   lisinopril-hydrochlorothiazide (ZESTORETIC) 10-12.5 MG tablet Take 1 tablet by mouth daily.   metFORMIN (GLUCOPHAGE) 1000 MG tablet Take  1,000 mg by mouth 2 (two) times daily. (Patient not taking: Reported on 10/24/2020)   Current Facility-Administered Medications (Other)  Medication Route   sodium chloride flush (NS) 0.9 % injection 3 mL Intravenous      REVIEW OF SYSTEMS:    ALLERGIES Allergies  Allergen Reactions   Nsaids Other (See Comments)    Elevated Cr.    PAST MEDICAL HISTORY Past Medical History:  Diagnosis Date   Aortic stenosis    Atrial fibrillation (HCC)    CAD (coronary artery disease)    Cataract, nuclear sclerotic, both eyes 12/02/2020   DM2 (diabetes mellitus, type 2) (HCC)    HLD (hyperlipidemia)    HTN (hypertension)    LBBB (left bundle branch block) 08/25/2020   S/P CABG (coronary artery bypass graft)    Past Surgical History:  Procedure Laterality Date   CORONARY ARTERY BYPASS GRAFT     RIGHT/LEFT HEART CATH AND CORONARY/GRAFT ANGIOGRAPHY N/A 09/24/2020   Procedure: RIGHT/LEFT HEART CATH AND CORONARY/GRAFT ANGIOGRAPHY;  Surgeon: Leonie Man, MD;  Location: [facility];  Service: Cardiovascular;  Laterality: N/A;    FAMILY HISTORY History reviewed. No pertinent family history.  SOCIAL HISTORY Social History   Tobacco Use   Smoking status: Never  Smokeless tobacco: Never  Vaping Use   Vaping Use: Never used  Substance Use Topics   Alcohol use: Never   Drug use: Never         OPHTHALMIC EXAM:  Base Eye Exam     Visual Acuity (ETDRS)       Right Left   Dist [place] 20/50 20/40   Dist ph [place] 20/25 20/30 +2    Correction: Glasses         Tonometry (Tonopen, 2:09 PM)       Right Left   Pressure 17 17         Pupils       Pupils Dark Light Shape React APD   Right PERRL 3 2 Round Brisk None   Left PERRL 3 2 Round Brisk None         Visual Fields (Counting fingers)       Left Right    Full Full         Extraocular Movement       Right Left    Full, Ortho Full, Ortho         Neuro/Psych     Oriented x3: Yes   Mood/Affect:  Normal         Dilation     Both eyes: 1.0% Mydriacyl, 2.5% Phenylephrine @ 2:09 PM           Slit Lamp and Fundus Exam     External Exam       Right Left   External Normal Normal         Slit Lamp Exam       Right Left   Lids/Lashes Normal Normal   Conjunctiva/Sclera White and quiet White and quiet   Cornea Clear Clear   Anterior Chamber Deep and quiet Deep and quiet   Iris Round and reactive Round and reactive   Lens Centered posterior chamber intraocular lens Centered posterior chamber intraocular lens   Anterior Vitreous Normal Normal         Fundus Exam       Right Left   Posterior Vitreous Normal Normal   Disc Peripapillary atrophy Peripapillary atrophy   C/D Ratio 0.5 0.5   Macula Early age related macular degeneration, Hard drusen, Epiretinal membrane mild topographic distortion Early age related macular degeneration, Hard drusen, Epiretinal membrane minor topo distortion   Vessels Normal Normal   Periphery Normal Normal            IMAGING AND PROCEDURES  Imaging and Procedures for 02/13/21  OCT, Retina - OU - Both Eyes       Right Eye Quality was good. Scan locations included subfoveal. Central Foveal Thickness: 330. Progression has no prior data. Findings include myopic contour, subretinal hyper-reflective material, retinal drusen , epiretinal membrane.   Left Eye Quality was good. Scan locations included subfoveal. Central Foveal Thickness: 365. Progression has no prior data. Findings include abnormal foveal contour, epiretinal membrane, myopic contour.   Notes OD Minor epiretinal membrane nasal to FAZ.  Thickening of the photoreceptor layer probably accounts for acuity OD  OS with Moderate epiretinal membrane with thickening of the fovea and elevation of the foveal depression, no impact on acuity will observe OS             ASSESSMENT/PLAN:  Macular pucker, left eye OS, mild to moderate macular thickening from epiretinal  membrane yet with good acuity will observe  Intermediate stage nonexudative age-related macular degeneration of both eyes No sign of  CNVM  Macular puckering, right eye Outer retinal change of the photoreceptor layer.  Minor impact on acuity will observe  Cataract, nuclear sclerotic, both eyes Condition resolved OU post cataract surgery  Pseudophakia, both eyes OU stable patient very happy with excellent acuity improvement after recent cataract surgery with Dr. Rutherford Guys  Type 2 diabetes mellitus without complication ([place]) No detectable diabetic retinopathy     ICD-10-CM   1. Macular pucker, left eye  H35.372 OCT, Retina - OU - Both Eyes    2. Macular puckering, right eye  H35.371 OCT, Retina - OU - Both Eyes    3. Intermediate stage nonexudative age-related macular degeneration of both eyes  H35.3132 OCT, Retina - OU - Both Eyes    4. Cataract, nuclear sclerotic, both eyes  H25.13     5. Pseudophakia, both eyes  Z96.1     6. Type 2 diabetes mellitus without complication, unspecified whether long term insulin use (HCC)  E11.9       1.  OU with now visually insignificant epiretinal membranes with excellent visual acuity recovery post recent cataract surgery and lens implantation OU.  2.  We will observe and look for signs of change in follow-up particularly the outer retina of the right eye  3.  Ophthalmic Meds Ordered this visit:  No orders of the defined types were placed in this encounter.      Return in about 4 months (around 06/14/2021) for DILATE OU, OCT.  There are no Patient Instructions on file for this visit.   Explained the diagnoses, plan, and follow up with the patient and they expressed understanding.  Patient expressed understanding of the importance of proper follow up care.   Clent Demark Ceazia Harb M.D. Diseases & Surgery of the Retina and Vitreous [facility] [place] 02/13/21     Abbreviations: M myopia (nearsighted); A astigmatism; H  hyperopia (farsighted); P presbyopia; Mrx spectacle prescription;  CTL contact lenses; OD right eye; OS left eye; OU both eyes  XT exotropia; ET esotropia; PEK punctate epithelial keratitis; PEE punctate epithelial erosions; DES dry eye syndrome; MGD meibomian gland dysfunction; ATs artificial tears; PFAT's preservative free artificial tears; [place] nuclear sclerotic cataract; PSC posterior subcapsular cataract; ERM epi-retinal membrane; PVD posterior vitreous detachment; RD retinal detachment; DM diabetes mellitus; DR diabetic retinopathy; NPDR non-proliferative diabetic retinopathy; PDR proliferative diabetic retinopathy; CSME clinically significant macular edema; DME diabetic macular edema; dbh dot blot hemorrhages; CWS cotton wool spot; POAG primary open angle glaucoma; C/D cup-to-disc ratio; HVF humphrey visual field; GVF goldmann visual field; OCT optical coherence tomography; IOP intraocular pressure; BRVO Branch retinal vein occlusion; CRVO central retinal vein occlusion; CRAO central retinal artery occlusion; BRAO branch retinal artery occlusion; RT retinal tear; SB scleral buckle; PPV pars plana vitrectomy; VH Vitreous hemorrhage; PRP panretinal laser photocoagulation; IVK intravitreal kenalog; VMT vitreomacular traction; MH Macular hole;  NVD neovascularization of the disc; NVE neovascularization elsewhere; AREDS age related eye disease study; ARMD age related macular degeneration; POAG primary open angle glaucoma; EBMD epithelial/anterior basement membrane dystrophy; ACIOL anterior chamber intraocular lens; IOL intraocular lens; PCIOL posterior chamber intraocular lens; Phaco/IOL phacoemulsification with intraocular lens placement; [place] photorefractive keratectomy; LASIK laser assisted in situ keratomileusis; HTN hypertension; DM diabetes mellitus; COPD chronic obstructive pulmonary disease

## 2021-02-13 NOTE — Assessment & Plan Note (Signed)
Outer retinal change of the photoreceptor layer.  Minor impact on acuity will observe

## 2021-02-13 NOTE — Assessment & Plan Note (Signed)
OU stable patient very happy with excellent acuity improvement after recent cataract surgery with Dr. Rutherford Guys

## 2021-02-13 NOTE — Assessment & Plan Note (Signed)
No detectable diabetic retinopathy 

## 2021-02-13 NOTE — Assessment & Plan Note (Signed)
Condition resolved OU post cataract surgery

## 2021-02-13 NOTE — Assessment & Plan Note (Signed)
OS, mild to moderate macular thickening from epiretinal membrane yet with good acuity will observe

## 2021-03-27 ENCOUNTER — Ambulatory Visit: Payer: Medicare Other | Admitting: Podiatry

## 2021-04-09 NOTE — Progress Notes (Unsigned)
Cardiology Office Note:    Date:  04/09/2021   ID:  Erik Hines, DOB Jun 15, 1940, MRN [id number]  PCP:  Pieter Partridge, PA   [facility] Providers Cardiologist:  Evalina Field, MD { Click to update primary MD,subspecialty MD or APP then REFRESH:1}    Referring MD: Pieter Partridge, PA   No chief complaint on file. ***  History of Present Illness:    Erik Hines is a 81 y.o. male with a hx of aortic stenosis, persistent atrial fib with RVR, CAD, HTN, LBBB, hyperlipidemia, DM, carotid stenosis, hearing loss, and meningioma.   He underwent CABG at [facility] in 2003 (radial-OM, SVG-RCA, SVG-LAD). He recalled being told at the time of surgery that he had mild aortic disease.    He was admitted 08/25/20 with new onset seizure witnessed by his son. He had brief episode of atrial fibrillation with RVR. This was previously noted by PCP in [place]. 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 Echo demonstrated moderate assymetric hypertrophy and findings consistent with severe stage D3 aortic stenosis (paradoxical low flow low gradient). He was discharged on Keppra with meningioma thought to be provoking seizures. During hospitalization, he developed atrial fibrillation and cardiology was consulted. He converted spontaneously back to sinus rhythm during hospitalization. Has a baseline LBBB.   Today,    Past Medical History:  Diagnosis Date   Aortic stenosis    Atrial fibrillation (HCC)    CAD (coronary artery disease)    Cataract, nuclear sclerotic, both eyes 12/02/2020   DM2 (diabetes mellitus, type 2) (HCC)    HLD (hyperlipidemia)    HTN (hypertension)    LBBB (left bundle branch block) 08/25/2020   S/P CABG (coronary artery bypass graft)     Past Surgical History:  Procedure Laterality Date   CORONARY ARTERY BYPASS GRAFT     RIGHT/LEFT HEART CATH AND CORONARY/GRAFT ANGIOGRAPHY N/A 09/24/2020    Procedure: RIGHT/LEFT HEART CATH AND CORONARY/GRAFT ANGIOGRAPHY;  Surgeon: Leonie Man, MD;  Location: [facility];  Service: Cardiovascular;  Laterality: N/A;    Current Medications: No outpatient medications have been marked as taking for the 04/10/21 encounter (Appointment) with Deberah Pelton, NP.   Current Facility-Administered Medications for the 04/10/21 encounter (Appointment) with Deberah Pelton, NP  Medication   sodium chloride flush (NS) 0.9 % injection 3 mL     Allergies:   Nsaids   Social History   Socioeconomic History   Marital status: Married    Spouse name: Not on file   Number of children: Not on file   Years of education: Not on file   Highest education level: Not on file  Occupational History   Not on file  Tobacco Use   Smoking status: Never   Smokeless tobacco: Never  Vaping Use   Vaping Use: Never used  Substance and Sexual Activity   Alcohol use: Never   Drug use: Never   Sexual activity: Not on file  Other Topics Concern   Not on file  Social History Narrative   Not on file   Social Determinants of Health   Financial Resource Strain: Not on file  Food Insecurity: Not on file  Transportation Needs: Not on file  Physical Activity: Not on file  Stress: Not on file  Social Connections: Not on file     Family History: The patient's ***family history is not on file.  ROS:  Please see the history of present illness.    *** All other systems reviewed and are negative.  Labs/Other Studies Reviewed:    The following studies were reviewed today: ***  Recent Labs: 08/25/2020: B Natriuretic Peptide 471.3; TSH 1.680 08/26/2020: Magnesium 1.7 08/27/2020: ALT 16 09/20/2020: Platelets 321 09/24/2020: Hemoglobin 10.9; Hemoglobin 10.5 11/05/2020: BUN 26; Creatinine, Ser 1.37; Potassium 4.9; Sodium 138  Recent Lipid Panel No results found for: CHOL, TRIG, HDL, CHOLHDL, VLDL, LDLCALC, LDLDIRECT   Risk Assessment/Calculations:    {Does this patient have ATRIAL FIBRILLATION?:[phone]}       Physical Exam:    VS:  There were no vitals taken for this visit.    Wt Readings from Last 3 Encounters:  10/24/20 168 lb 2 oz (76.3 kg)  09/30/20 172 lb 12.8 oz (78.4 kg)  09/24/20 177 lb (80.3 kg)     GEN: *** Well nourished, well developed in no acute distress HEENT: Normal NECK: No JVD; No carotid bruits CARDIAC: ***RRR, no murmurs, rubs, gallops RESPIRATORY:  Clear to auscultation without rales, wheezing or rhonchi  ABDOMEN: Soft, non-tender, non-distended MUSCULOSKELETAL:  No edema; No deformity. *** pedal pulses, ***bilaterally SKIN: Warm and dry NEUROLOGIC:  Alert and oriented x 3 PSYCHIATRIC:  Normal affect   EKG:  EKG is *** ordered today.  The ekg ordered today demonstrates ***  Diagnoses:    No diagnosis found. Assessment and Plan:     ***          {Are you ordering a CV Procedure (e.g. stress test, cath, DCCV, TEE, etc)?   Press F2        :[id number]}    Medication Adjustments/Labs and Tests Ordered: Current medicines are reviewed at length with the patient today.  Concerns regarding medicines are outlined above.  No orders of the defined types were placed in this encounter.  No orders of the defined types were placed in this encounter.   There are no Patient Instructions on file for this visit.   Signed, Emmaline Life, NP  04/09/2021 4:32 PM    [facility]

## 2021-04-10 ENCOUNTER — Ambulatory Visit (HOSPITAL_BASED_OUTPATIENT_CLINIC_OR_DEPARTMENT_OTHER): Payer: Medicare Other | Admitting: Nurse Practitioner

## 2021-04-14 ENCOUNTER — Other Ambulatory Visit: Payer: Self-pay | Admitting: Radiation Therapy

## 2021-04-15 ENCOUNTER — Ambulatory Visit (HOSPITAL_COMMUNITY): Payer: Medicare Other | Attending: Internal Medicine

## 2021-04-15 ENCOUNTER — Other Ambulatory Visit: Payer: Self-pay

## 2021-04-15 DIAGNOSIS — I350 Nonrheumatic aortic (valve) stenosis: Secondary | ICD-10-CM | POA: Diagnosis present

## 2021-04-15 LAB — ECHOCARDIOGRAM COMPLETE
AR max vel: 1.23 cm2
AV Area VTI: 1.35 cm2
AV Area mean vel: 1.19 cm2
AV Mean grad: 17.8 mmHg
AV Peak grad: 31.1 mmHg
Ao pk vel: 2.79 m/s
Area-P 1/2: 3.17 cm2
S' Lateral: 2.8 cm

## 2021-04-16 ENCOUNTER — Telehealth: Payer: Self-pay | Admitting: Cardiovascular Disease

## 2021-04-16 NOTE — Telephone Encounter (Signed)
Spoke with pt's daughter and pt was seen by PCP on a Thursday and EKG was done and pt was in afib rate 113.Per daughter PCP stopped Lisinopril and started  Metoprolol we believe  Pt returned to PCP on Monday and HR was better according to daughter Pt only notes a fluttering sensation in shoulder no other complaints or symptoms Pt has appt 05/05/21 with Dr Burt Knack and wanted to know if would be okay to change visit to phone visit Pt needs to be out of town for several weeks Per daughter will do whatever needs to be done per Dr Burt Knack the patient is on Eliquis Will forward to Dr Burt Knack for review and recommendations ./cy ?

## 2021-04-16 NOTE — Telephone Encounter (Signed)
Patient c/o Palpitations:  High priority if patient c/o lightheadedness, shortness of breath, or chest pain ? ?How long have you had palpitations/irregular HR/ Afib? Started around 2/15 when he went to his PCP ? ? Are you having the symptoms now? Not sure ? ?Are you currently experiencing lightheadedness, SOB or CP? no ? ?Do you have a history of afib (atrial fibrillation) or irregular heart rhythm? yes ? ?Have you checked your BP or HR? (document readings if available):  ? ?Are you experiencing any other symptoms? No ? ?Patient's daughter said that the patient did not have any afib symptoms but when he went to his PCP the EKG that was done showed that he was in afib. The patient had a repeat EKG done and the "numbers looked better" according to the daughter but the PCP wanted to have the patient follow up with Dr. Burt Knack ? ?The patient's daughter also wanted to know ifthe Echo that was done yesterday would show if he was in afib or not  ?

## 2021-04-18 ENCOUNTER — Ambulatory Visit (HOSPITAL_COMMUNITY)
Admission: RE | Admit: 2021-04-18 | Discharge: 2021-04-18 | Disposition: A | Discharge status: Home / Self Care | Payer: Medicare Other | Source: Ambulatory Visit | Attending: Internal Medicine | Admitting: Internal Medicine

## 2021-04-18 ENCOUNTER — Other Ambulatory Visit: Payer: Self-pay

## 2021-04-18 DIAGNOSIS — D329 Benign neoplasm of meninges, unspecified: Secondary | ICD-10-CM | POA: Insufficient documentation

## 2021-04-18 MED ORDER — GADOBUTROL 1 MMOL/ML IV SOLN
7.0000 mL | Freq: Once | INTRAVENOUS | Status: AC | PRN
  Administered 2021-04-18: 7 mL via INTRAVENOUS

## 2021-04-18 NOTE — Telephone Encounter (Signed)
Probably best to do this kind of visit in person, but happy to do virtually if that works best for the patient. If virtual, can decide if necessary for him to come in person at a later date. ?

## 2021-04-18 NOTE — Telephone Encounter (Signed)
Called and spoke to daughter [name] (on [place]) with patient present. Informed them that Burt Knack recommends a face-to-face visit, but can accommodate the virtual if absolutely necessary with potential to need to see pt in office shortly after. [name] states that they are fine with coming in on 3/20, but was unsure since they didn't follow-up with recommended cardiology following the visit with his PCP. Advised her to call us if Herberto develops any symptom at all, otherwise we will see them on 3/20. ?

## 2021-04-21 ENCOUNTER — Inpatient Hospital Stay: Payer: Medicare Other | Attending: Internal Medicine

## 2021-04-23 ENCOUNTER — Emergency Department (HOSPITAL_BASED_OUTPATIENT_CLINIC_OR_DEPARTMENT_OTHER): Payer: Medicare Other

## 2021-04-23 ENCOUNTER — Telehealth: Payer: Self-pay | Admitting: Internal Medicine

## 2021-04-23 ENCOUNTER — Encounter (HOSPITAL_BASED_OUTPATIENT_CLINIC_OR_DEPARTMENT_OTHER): Payer: Self-pay | Admitting: *Deleted

## 2021-04-23 ENCOUNTER — Telehealth: Payer: Self-pay | Admitting: Cardiovascular Disease

## 2021-04-23 ENCOUNTER — Other Ambulatory Visit: Payer: Self-pay

## 2021-04-23 ENCOUNTER — Emergency Department (HOSPITAL_BASED_OUTPATIENT_CLINIC_OR_DEPARTMENT_OTHER)
Admission: EM | Admit: 2021-04-23 | Discharge: 2021-04-23 | Disposition: A | Discharge status: Home / Self Care | Payer: Medicare Other | Attending: Emergency Medicine | Admitting: Emergency Medicine

## 2021-04-23 DIAGNOSIS — Z7901 Long term (current) use of anticoagulants: Secondary | ICD-10-CM | POA: Diagnosis not present

## 2021-04-23 DIAGNOSIS — R011 Cardiac murmur, unspecified: Secondary | ICD-10-CM | POA: Insufficient documentation

## 2021-04-23 DIAGNOSIS — R479 Unspecified speech disturbances: Secondary | ICD-10-CM

## 2021-04-23 DIAGNOSIS — R4702 Dysphasia: Secondary | ICD-10-CM | POA: Insufficient documentation

## 2021-04-23 DIAGNOSIS — R7309 Other abnormal glucose: Secondary | ICD-10-CM | POA: Diagnosis not present

## 2021-04-23 LAB — CBC
HCT: 38 % — ABNORMAL LOW (ref 39.0–52.0)
Hemoglobin: 12.3 g/dL — ABNORMAL LOW (ref 13.0–17.0)
MCH: 28.7 pg (ref 26.0–34.0)
MCHC: 32.4 g/dL (ref 30.0–36.0)
MCV: 88.8 fL (ref 80.0–100.0)
Platelets: 224 10*3/uL (ref 150–400)
RBC: 4.28 MIL/uL (ref 4.22–5.81)
RDW: 13.4 % (ref 11.5–15.5)
WBC: 8.9 10*3/uL (ref 4.0–10.5)
nRBC: 0 % (ref 0.0–0.2)

## 2021-04-23 LAB — BASIC METABOLIC PANEL
Anion gap: 14 (ref 5–15)
BUN: 24 mg/dL — ABNORMAL HIGH (ref 8–23)
CO2: 21 mmol/L — ABNORMAL LOW (ref 22–32)
Calcium: 9.7 mg/dL (ref 8.9–10.3)
Chloride: 100 mmol/L (ref 98–111)
Creatinine, Ser: 1.62 mg/dL — ABNORMAL HIGH (ref 0.61–1.24)
GFR, Estimated: 42 mL/min — ABNORMAL LOW (ref >60)
Glucose, Bld: 230 mg/dL — ABNORMAL HIGH (ref 70–99)
Potassium: 4.9 mmol/L (ref 3.5–5.1)
Sodium: 135 mmol/L (ref 135–145)

## 2021-04-23 LAB — URINALYSIS, MICROSCOPIC (REFLEX)

## 2021-04-23 LAB — URINALYSIS, ROUTINE W REFLEX MICROSCOPIC
Bilirubin Urine: NEGATIVE
Glucose, UA: >=500 mg/dL — AB
Ketones, ur: NEGATIVE mg/dL
Leukocytes,Ua: NEGATIVE
Nitrite: NEGATIVE
Protein, ur: 30 mg/dL — AB
Specific Gravity, Urine: 1.01 (ref 1.005–1.030)
pH: 5 (ref 5.0–8.0)

## 2021-04-23 LAB — TROPONIN I (HIGH SENSITIVITY): Troponin I (High Sensitivity): 15 ng/L (ref <18)

## 2021-04-23 LAB — CBG MONITORING, ED: Glucose-Capillary: 208 mg/dL — ABNORMAL HIGH (ref 70–99)

## 2021-04-23 NOTE — Discharge Instructions (Signed)
You had an MRI performed of your brain today.  We do not see any signs of a stroke on the scan.  Your blood tests also did not show any medical emergencies to explain your symptoms.  I recommend that you follow-up with your doctor next week.  We do not test B vitamin levels in the ER, but I would recommend you talk to your doctor's office about rechecking your levels.  I also strongly recommend that you get back on your B12 vitamins.  Use your cane or a walker at all times for extra balance at home. ?

## 2021-04-23 NOTE — ED Triage Notes (Signed)
Arrived via [facility], pt ambulated from EMS truck to exam room 4, per EMS, pt stated he had approx a 3 min episode of slurred speech yesterday. Per EMS, BEFAST, VAN negative, denies any vision issues, gait issues or headaches ?

## 2021-04-23 NOTE — ED Provider Notes (Signed)
[facility] EMERGENCY DEPARTMENT Provider Note   CSN: [id number] Arrival date & time: 04/23/21  1006     History  Chief Complaint  Patient presents with   Aphasia    Erik Hines is a 81 y.o. 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  This episode occurred yesterday, lasting about 3 minutes as witnessed by his wife, rehab garbled speech and seems confused.  Subsequently resolved.  No seizure activity noted.  The patient feels asymptomatic now, denies headache numbness or weakness.  He denies history of stroke or TIA.  He is on Eliquis for paroxysmal A-fib.  He has a history of aortic stenosis and undergoing cardiology evaluation for possible aortic valve replacement.  He says he had his first seizure episode about a year ago, started on Keppra in September and has been taking at 500 mg twice a day.  MRI brain performed 5 days ago as outpatient 04/18/21: COMPARISON:  08/26/2020   FINDINGS: Brain: Similar size of dural-based enhancing lesion along the posterior right parietal convexity. As before this abuts but does not significantly compress or invade the superior sagittal sinus. There is unchanged mild underlying parenchymal edema.   No new mass or abnormal enhancement. No acute infarction or hemorrhage. Stable prominence of the ventricles and sulci reflecting parenchymal volume loss. Patchy foci of T2 hyperintensity in the supratentorial white matter probably reflects stable chronic microvascular ischemic changes. No extra-axial collection.   Vascular: Major vessel flow voids at the skull base are preserved.   Skull and upper cervical spine: Normal marrow signal is preserved.   Sinuses/Orbits: Chronic right maxillary sinusitis. Bilateral lens replacements.   Other: Sella is unremarkable.  Mastoid air cells are clear.   IMPRESSION: No substantial  change in right posterior parietal convexity meningioma. Stable mild underlying parenchymal edema.  HPI     Home Medications Prior to Admission medications   Medication Sig Start Date End Date Taking? Authorizing Provider  apixaban (ELIQUIS) 5 MG TABS tablet Take 1 tablet (5 mg total) by mouth 2 (two) times daily. 09/17/20  Yes Loel Dubonnet, NP  levETIRAcetam (KEPPRA) 500 MG tablet Take 1 tablet (500 mg total) by mouth 2 (two) times daily. 10/24/20  Yes Vaslow, Acey Lav, MD  atorvastatin (LIPITOR) 40 MG tablet Take 40 mg by mouth daily. 03/14/20 03/15/21  [provider]  cyanocobalamin 1000 MCG tablet Take 1 tablet (1,000 mcg total) by mouth daily. 09/03/20   Ghimire, Henreitta Leber, MD  ergocalciferol (VITAMIN D2) 1.25 MG (50000 UT) capsule Take 50,000 Units by mouth once a week.    [provider]  insulin detemir (LEVEMIR) 100 UNIT/ML injection Inject 18 Units into the skin 2 (two) times daily. 12/26/19 12/26/20  [provider]  lisinopril-hydrochlorothiazide (ZESTORETIC) 10-12.5 MG tablet Take 1 tablet by mouth daily.    [provider]  metFORMIN (GLUCOPHAGE) 1000 MG tablet Take 1,000 mg by mouth 2 (two) times daily. Patient not taking: Reported on 10/24/2020 03/14/20 03/15/21  [provider]      Allergies    Nsaids    Review of Systems   Review of Systems  Physical Exam Updated Vital Signs BP (!) 157/71 (BP Location: Right Arm)    Pulse 61    Temp 97.9 F (36.6 C)    Resp 16    Ht '5\' 9"'$  (1.753 m)    Wt 76.3 kg    SpO2 100%  BMI 24.84 kg/m  Physical Exam Constitutional:      General: He is not in acute distress. HENT:     Head: Normocephalic and atraumatic.  Eyes:     Conjunctiva/sclera: Conjunctivae normal.     Pupils: Pupils are equal, round, and reactive to light.  Cardiovascular:     Rate and Rhythm: Normal rate and regular rhythm.     Heart sounds: Murmur heard.  Pulmonary:     Effort: Pulmonary effort is normal. No  respiratory distress.  Abdominal:     General: There is no distension.     Tenderness: There is no abdominal tenderness.  Skin:    General: Skin is warm and dry.  Neurological:     General: No focal deficit present.     Mental Status: He is alert and oriented to person, place, and time. Mental status is at baseline.     Cranial Nerves: No cranial nerve deficit.     Sensory: No sensory deficit.     Motor: No weakness.  Psychiatric:        Mood and Affect: Mood normal.        Behavior: Behavior normal.    ED Results / Procedures / Treatments   Labs (all labs ordered are listed, but only abnormal results are displayed) Labs Reviewed  BASIC METABOLIC PANEL - Abnormal; Notable for the following components:      Result Value   CO2 21 (*)    Glucose, Bld 230 (*)    BUN 24 (*)    Creatinine, Ser 1.62 (*)    GFR, Estimated 42 (*)    All other components within normal limits  CBC - Abnormal; Notable for the following components:   Hemoglobin 12.3 (*)    HCT 38.0 (*)    All other components within normal limits  URINALYSIS, ROUTINE W REFLEX MICROSCOPIC - Abnormal; Notable for the following components:   Glucose, UA >=500 (*)    Hgb urine dipstick TRACE (*)    Protein, ur 30 (*)    All other components within normal limits  URINALYSIS, MICROSCOPIC (REFLEX) - Abnormal; Notable for the following components:   Bacteria, UA RARE (*)    All other components within normal limits  CBG MONITORING, ED - Abnormal; Notable for the following components:   Glucose-Capillary 208 (*)    All other components within normal limits  CBG MONITORING, ED  TROPONIN I (HIGH SENSITIVITY)    EKG EKG Interpretation  Date/Time:  Wednesday April 23 2021 10:10:03 EST Ventricular Rate:  74 PR Interval:  340 QRS Duration: 115 QT Interval:  395 QTC Calculation: 439 R Axis:   11 Text Interpretation: Sinus rhythm with prolonged PR interval vs A Fib Incomplete left bundle branch block Consider anterior  infarct Confirmed by Octaviano Glow [phone]) on 04/23/2021 10:42:52 AM  Radiology MR BRAIN WO CONTRAST  Result Date: 04/23/2021 CLINICAL DATA:  TIA, expressive aphasia, history of right parietal meningioma EXAM: MRI HEAD WITHOUT CONTRAST TECHNIQUE: Multiplanar, multiecho pulse sequences of the brain and surrounding structures were obtained without intravenous contrast. COMPARISON:  06/18/2021 FINDINGS: Brain: No restricted diffusion to suggest acute or subacute infarct. No acute hemorrhage, mass effect, or midline shift. Redemonstrated dural-based enhancing lesion along the posterior right parietal convexity, with unchanged mild underlying parenchymal edema, which is better evaluated on 04/18/2021. No hydrocephalus or extra-axial collection. Unchanged mild central atrophy. T2 hyperintense signal in the periventricular white matter, likely the sequela of mild chronic small vessel ischemic disease. Vascular: Normal flow voids.  Skull and upper cervical spine: Normal marrow signal. Sinuses/Orbits: Chronic right maxillary sinusitis. Left maxillary mucous retention cyst. Mild mucosal thickening in the sphenoid sinuses. Status post bilateral lens replacements. Other: Trace fluid in the right mastoid air cells. IMPRESSION: 1.  No acute intracranial process. 2. Right parietal meningioma is better evaluated on the 04/18/2021 MRI. Electronically Signed   By: Merilyn Baba M.D.   On: 04/23/2021 13:29    Procedures Procedures    Medications Ordered in ED Medications - No data to display  ED Course/ Medical Decision Making/ A&P Clinical Course as of 04/23/21 1800  Wed Apr 23, 2021  1345 Patient remains asymptomatic.  MRI does not show any focal infarct.  I will lower suspicion for stroke/seizure.  Suspect BP erroneous 140/124 likely 2/2 movement.  Okay for discharge [MT]    Clinical Course User Index [MT] Keelia Graybill, Carola Rhine, MD                           Medical Decision Making Amount and/or Complexity of Data  Reviewed Labs: ordered. Radiology: ordered. ECG/medicine tests: ordered.   Patient is presenting to emergency department with expressive aphasia, transient occurring yesterday.  Differential diagnosis would include TIA versus infection versus partial epileptic episode versus other  CT scanner is down.  We do have MRI available and I have ordered MRI of the brain.  He currently has a stroke scale of 0 and is asymptomatic with no acute stroke symptoms.  I personally reviewed and interpreted patient's labs, EKG and imaging, which showed no acute emergent findings.  I reviewed his external records including the MRI of his brain performed 5 days ago.  MRI brain today reviewed without focal findings - stable meningioma.   Both daughters present at discharge to discuss workup        Final Clinical Impression(s) / ED Diagnoses Final diagnoses:  Difficulty speaking    Rx / DC Orders ED Discharge Orders     None         Freeland Pracht, Carola Rhine, MD 04/23/21 1801

## 2021-04-23 NOTE — Telephone Encounter (Signed)
Patients daughter is taking patient to ER. Says that he did not remember things that was told to him from yesterday and was talking "jibberish". Told daughter patient needs to be taken to ER immediately due to sign of stroke. Told her I would get someone from Dr. Antionette Char office to contact her. ?

## 2021-04-23 NOTE — Telephone Encounter (Signed)
I spoke with patient's daughter.  She is currently in [place].  She reports patient was confused about upcoming trip plans yesterday.  His wife (who has dementia) reported he was mumbling yesterday.  Today daughter reports patient's speech is clear but he is still confused.  Patient recently diagnosed with atrial fib.  Daughter reports she is going to take patient to ED but she is an hour away.  Due to recent changes I advised her patient should be evaluated in ED as soon as possible and she should call 911.  Daughter agreeable with this plan ?

## 2021-04-23 NOTE — Telephone Encounter (Signed)
Canceled appointment per inbasket message. Patient said they will call back after they get their mri results to rescheduled.  ?

## 2021-04-23 NOTE — ED Notes (Signed)
BEFAST / VAN negative, placed on cont cardiac monitoring, with int NBP assessments, CBG and ECG performed upon arrival ?

## 2021-04-24 ENCOUNTER — Inpatient Hospital Stay: Payer: Medicare Other | Admitting: Internal Medicine

## 2021-04-24 NOTE — Telephone Encounter (Signed)
Thx. Pt scheduled to see me 3/20. ER testing and notes reviewed.  ?

## 2021-05-05 ENCOUNTER — Other Ambulatory Visit: Payer: Self-pay

## 2021-05-05 ENCOUNTER — Encounter: Payer: Self-pay | Admitting: Cardiovascular Disease

## 2021-05-05 ENCOUNTER — Other Ambulatory Visit (HOSPITAL_COMMUNITY): Payer: Medicare Other

## 2021-05-05 ENCOUNTER — Ambulatory Visit (INDEPENDENT_AMBULATORY_CARE_PROVIDER_SITE_OTHER): Payer: Medicare Other | Admitting: Cardiovascular Disease

## 2021-05-05 VITALS — BP 140/70 | HR 68 | Ht 68.0 in | Wt 161.4 lb

## 2021-05-05 DIAGNOSIS — I350 Nonrheumatic aortic (valve) stenosis: Secondary | ICD-10-CM | POA: Diagnosis not present

## 2021-05-05 DIAGNOSIS — E782 Mixed hyperlipidemia: Secondary | ICD-10-CM

## 2021-05-05 DIAGNOSIS — I480 Paroxysmal atrial fibrillation: Secondary | ICD-10-CM

## 2021-05-05 DIAGNOSIS — I10 Essential (primary) hypertension: Secondary | ICD-10-CM

## 2021-05-05 DIAGNOSIS — I2510 Atherosclerotic heart disease of native coronary artery without angina pectoris: Secondary | ICD-10-CM | POA: Diagnosis not present

## 2021-05-05 NOTE — Progress Notes (Signed)
?Cardiology Office Note:   ? ?Date:  05/05/2021  ? ?ID:  Erik Hines, DOB February 25, 1940, MRN [id number] ? ?PCP:  Pieter Partridge, PA ?  ?[facility] Providers ?Cardiologist:  Evalina Field, MD    ? ?Referring MD: Pieter Partridge, PA  ? ?Chief Complaint  ?Patient presents with  ? Aortic Stenosis  ? ? ?History of Present Illness:   ? ?Erik Hines is a 81 y.o. male presenting for follow-up evaluation of aortic stenosis.  The patient was initially seen in August 2022 for evaluation of paradoxical low-flow low gradient aortic stenosis.  At that time he was noted to have an LVEF of 55 to 60% with peak and mean transaortic gradients of 33 and 18 mmHg respectively.  The dimensionless index was 0.23 and calculated aortic valve area 0.8 cm?Marland Kitchen  CTA studies demonstrated an aortic valve calcium score of 1661, annular area of 509 mm?, large sinuses ranging from 32 to 34 mm, and anatomy suitable for a 26 mm Edwards SAPIEN 3 valve.  Patent bypass grafts were demonstrated as well.  Cardiac catheterization demonstrated patent bypass grafts with inability to engage the saphenous vein graft to LAD.  Transaortic gradient at cath was 23 mmHg.  The patient recently underwent a follow-up echocardiogram which demonstrates stable LVEF of 55 to 60% and again demonstrates moderate to severe aortic stenosis with a mean transvalvular gradient of 18 mmHg and peak gradient of 33 mmHg.  Stroke-volume index and aortic valve area were consistent with moderate aortic stenosis.  In addition, the patient has a history of paroxysmal atrial fibrillation.  Since last year he has been anticoagulated with apixaban.  In February of this year he was found to be in atrial fibrillation with a heart rate of about 120 bpm, noted by his primary care physician.  His medications were changed at that time and lisinopril/hydrochlorothiazide was stopped.  Metoprolol was added to his regimen.  A follow-up EKG showed that he was back in sinus rhythm.  The  patient is not aware of his atrial fibrillation.  He specifically denies chest pain, chest pressure, shortness of breath, lightheadedness, or heart palpitations.  Overall he is feeling well.  He is about to relocate to [place], [place].  He is interested in cardiology follow-up in that area. ? ?Past Medical History:  ?Diagnosis Date  ? Aortic stenosis   ? Atrial fibrillation ([place])   ? CAD (coronary artery disease)   ? Cataract, nuclear sclerotic, both eyes 12/02/2020  ? DM2 (diabetes mellitus, type 2) ([place])   ? HLD (hyperlipidemia)   ? HTN (hypertension)   ? LBBB (left bundle branch block) 08/25/2020  ? S/P CABG (coronary artery bypass graft)   ? ? ?Past Surgical History:  ?Procedure Laterality Date  ? CORONARY ARTERY BYPASS GRAFT    ? RIGHT/LEFT HEART CATH AND CORONARY/GRAFT ANGIOGRAPHY N/A 09/24/2020  ? Procedure: RIGHT/LEFT HEART CATH AND CORONARY/GRAFT ANGIOGRAPHY;  Surgeon: Leonie Man, MD;  Location: [facility];  Service: Cardiovascular;  Laterality: N/A;  ? ? ?Current Medications: ?Current Meds  ?Medication Sig  ? apixaban (ELIQUIS) 5 MG TABS tablet Take 1 tablet (5 mg total) by mouth 2 (two) times daily.  ? atorvastatin (LIPITOR) 40 MG tablet Take 40 mg by mouth daily.  ? cyanocobalamin 1000 MCG tablet Take 1 tablet (1,000 mcg total) by mouth daily.  ? empagliflozin (JARDIANCE) 25 MG TABS tablet TAKE ONE-HALF TABLET BY MOUTH EVERY MORNING FOR DIABETES  ? ergocalciferol (VITAMIN D2) 1.25 MG (50000  UT) capsule Take 50,000 Units by mouth once a week.  ? finasteride (PROSCAR) 5 MG tablet Take 1 tablet by mouth daily.  ? insulin detemir (LEVEMIR) 100 UNIT/ML injection Inject 18 Units into the skin 2 (two) times daily.  ? levETIRAcetam (KEPPRA) 500 MG tablet Take 1 tablet (500 mg total) by mouth 2 (two) times daily.  ? metFORMIN (GLUCOPHAGE) 1000 MG tablet Take 1,000 mg by mouth 2 (two) times daily.  ? metoprolol succinate (TOPROL-XL) 50 MG 24 hr tablet TAKE ONE-HALF TABLET BY MOUTH ONCE DAILY  FOR ARTIAL FIBRILLATION  ? ?Current Facility-Administered Medications for the 05/05/21 encounter (Office Visit) with Sherren Mocha, MD  ?Medication  ? sodium chloride flush (NS) 0.9 % injection 3 mL  ?  ? ?Allergies:   Nsaids  ? ?Social History  ? ?Socioeconomic History  ? Marital status: Married  ?  Spouse name: Not on file  ? Number of children: Not on file  ? Years of education: Not on file  ? Highest education level: Not on file  ?Occupational History  ? Not on file  ?Tobacco Use  ? Smoking status: Never  ? Smokeless tobacco: Never  ?Vaping Use  ? Vaping Use: Never used  ?Substance and Sexual Activity  ? Alcohol use: Never  ? Drug use: Never  ? Sexual activity: Not on file  ?Other Topics Concern  ? Not on file  ?Social History Narrative  ? Not on file  ? ?Social Determinants of Health  ? ?Financial Resource Strain: Not on file  ?Food Insecurity: Not on file  ?Transportation Needs: Not on file  ?Physical Activity: Not on file  ?Stress: Not on file  ?Social Connections: Not on file  ?  ? ?Family History: ?The patient's family history is not on file. ? ?ROS:   ?Please see the history of present illness.    ?All other systems reviewed and are negative. ? ?EKGs/Labs/Other Studies Reviewed:   ? ?The following studies were reviewed today: ?2D Echo: ? 1. Left ventricular ejection fraction, by estimation, is 55 to 60%. The  ?left ventricle has normal function. The left ventricle has no regional  ?wall motion abnormalities. There is mild left ventricular hypertrophy of  ?the sigmoid septum segment. Left  ?ventricular diastolic parameters are indeterminate. The average left  ?ventricular global longitudinal strain is -20.4 %. The global longitudinal  ?strain is normal.  ? 2. Right ventricular systolic function is normal. The right ventricular  ?size is normal. There is normal pulmonary artery systolic pressure.  ? 3. Mild mitral valve regurgitation.  ? 4. Aortic valve regurgitation is mild.  ? 5. The inferior vena cava is  normal in size with greater than 50%  ?respiratory variability, suggesting right atrial pressure of 3 mmHg.  ? ?Comparison(s): 08/26/20 EF55-60%. Moderate-severe AS 27mHg mean PG, 345mg  ?peak PG.  ? ?FINDINGS  ? Left Ventricle: Left ventricular ejection fraction, by estimation, is 55  ?to 60%. The left ventricle has normal function. The left ventricle has no  ?regional wall motion abnormalities. The average left ventricular global  ?longitudinal strain is -20.4 %.  ?The global longitudinal strain is normal. The left ventricular internal  ?cavity size was normal in size. There is mild left ventricular hypertrophy  ?of the sigmoid septum segment. Abnormal (paradoxical) septal motion,  ?consistent with left bundle branch  ?block. Left ventricular diastolic parameters are indeterminate.  ? ?Right Ventricle: The right ventricular size is normal. No increase in  ?right ventricular wall thickness. Right ventricular systolic function  is  ?normal. There is normal pulmonary artery systolic pressure. The tricuspid  ?regurgitant velocity is 2.82 m/s, and  ? with an assumed right atrial pressure of 3 mmHg, the estimated right  ?ventricular systolic pressure is 16.1 mmHg.  ? ?Left Atrium: Left atrial size was normal in size.  ? ?Right Atrium: Right atrial size was normal in size.  ? ?Pericardium: There is no evidence of pericardial effusion.  ? ?Mitral Valve: There is moderate calcification of the mitral valve  ?leaflet(s). Mild mitral valve regurgitation.  ? ?Tricuspid Valve: The tricuspid valve is grossly normal. Tricuspid valve  ?regurgitation is mild.  ? ?Aortic Valve: Severely calcified aortic valve, mean AV gradient 17 mmHg.  ?AV max 2.8 m/s, AVA > 1 cm2, DI > 0.25. SVi 43 cc/m2 consistent mild  ?aortic stenosis. Aortic valve regurgitation is mild. Aortic valve mean  ?gradient measures 17.8 mmHg. Aortic valve  ? peak gradient measures 31.1 mmHg. Aortic valve area, by VTI measures 1.35  ?cm?.  ? ?Pulmonic Valve: The  pulmonic valve was not well visualized. Pulmonic valve  ?regurgitation is trivial.  ? ?Aorta: The aortic root and ascending aorta are structurally normal, with  ?no evidence of dilitation.  ? ?Venous: The inferio

## 2021-05-05 NOTE — Patient Instructions (Signed)
Medication Instructions:  ?Your physician recommends that you continue on your current medications as directed. Please refer to the Current Medication list given to you today. ? ?*If you need a refill on your cardiac medications before your next appointment, please call your pharmacy* ? ? ?Lab Work: ?NONE ?If you have labs (blood work) drawn today and your tests are completely normal, you will receive your results only by: ?MyChart Message (if you have MyChart) OR ?A paper copy in the mail ?If you have any lab test that is abnormal or we need to change your treatment, we will call you to review the results. ? ? ?Testing/Procedures: ?Referred to [facility] Cardiology- Dr. Kara Mead [address] ?[place] [address] ? ? ?Follow-Up: ?At [facility], you and your health needs are our priority.  As part of our continuing mission to provide you with exceptional heart care, we have created designated Provider Care Teams.  These Care Teams include your primary Cardiologist (physician) and Advanced Practice Providers (APPs -  Physician Assistants and Nurse Practitioners) who all work together to provide you with the care you need, when you need it. ? ?Your next appointment:   ?1 year(s)}  ? ?  ?

## 2021-05-07 ENCOUNTER — Telehealth: Payer: Self-pay | Admitting: Internal Medicine

## 2021-05-07 NOTE — Telephone Encounter (Signed)
Call pt to schedule, spoke with pt daughter who informed me they were out of town and now was not a good time to schedule, pt daughter said she would call back to schedule on week of 3/27. Desk nurse notified. ?

## 2021-05-30 ENCOUNTER — Encounter: Payer: Self-pay | Admitting: Cardiovascular Disease

## 2021-06-16 ENCOUNTER — Encounter (INDEPENDENT_AMBULATORY_CARE_PROVIDER_SITE_OTHER): Payer: Medicare Other | Admitting: Ophthalmology

## 2021-07-04 ENCOUNTER — Telehealth: Payer: Self-pay

## 2021-07-04 NOTE — Telephone Encounter (Signed)
Patients daughter, [name] [phone]) called and LM about a referral to a neuro oncologist in [place], [place].   Routed to Shelle Iron, RN

## 2021-10-21 ENCOUNTER — Telehealth: Payer: Self-pay | Admitting: *Deleted

## 2021-10-21 NOTE — Telephone Encounter (Signed)
Patient has relocated to [place] and the new Neurology provider has requested records.  Routing to Dr Myer H[facility]. [address] [place] [address]  [phone] Phone [phone] Fax

## 2023-02-25 IMAGING — CT CT HEAD W/O CM
5 of 6 series · 17 of 47 positions shown, 18 images · non-contrast
Comparison: None.

CLINICAL DATA: 80-year-old male with altered mental status,
headache and possible seizure.

EXAM:
CT HEAD WITHOUT CONTRAST
CT CERVICAL SPINE WITHOUT CONTRAST
TECHNIQUE: Multidetector CT imaging of the head and cervical spine was
performed following the standard protocol without intravenous
contrast. Multiplanar CT image reconstructions of the cervical spine
were also generated.

[Series 3: head wo · axial · 0.46mm/px · z∈[+1306,+1410]mm · 4 of 37 slices shown, 5 images (1 of 2)]
[im 8/37  brain]
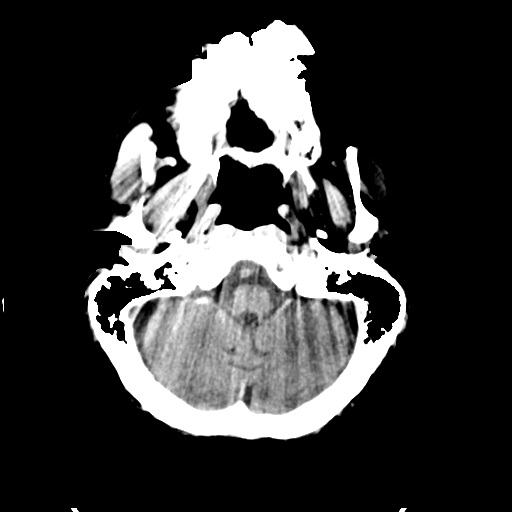
[im 8/37  bone]
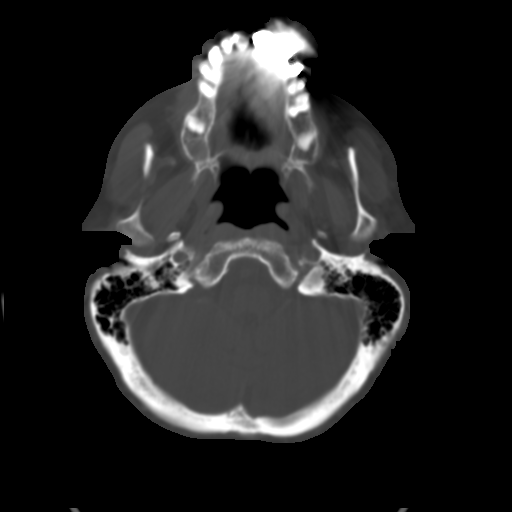
[im 15/37  brain]
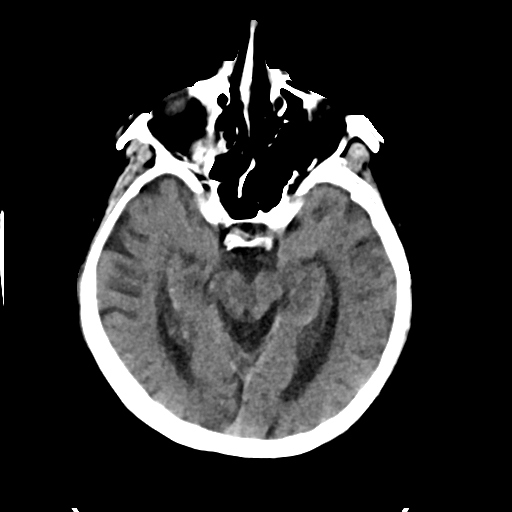
[im 22/37  brain]
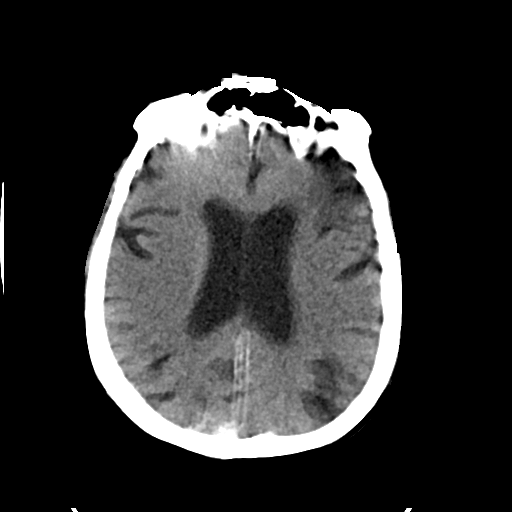
[im 29/37  brain]
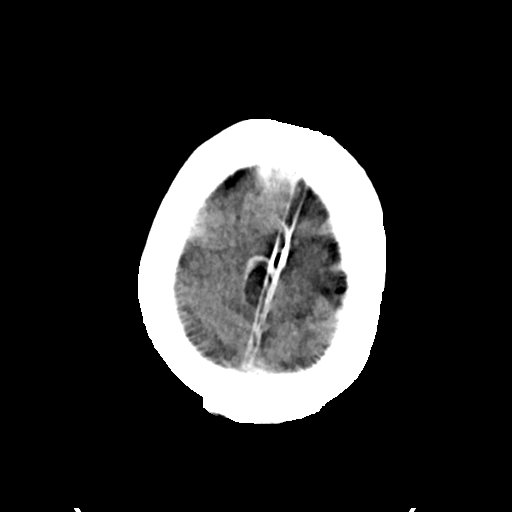

[Series 6: head wo · axial · 0.46mm/px · z∈[+1306,+1406]mm · 4 of 34 slices shown (2 of 2)]
[im 7/34  brain]
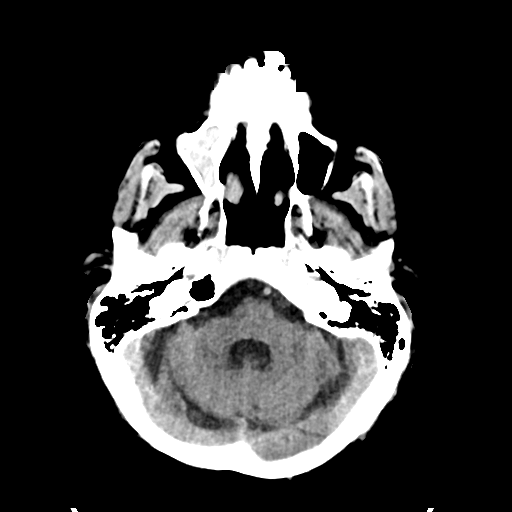
[im 14/34  brain]
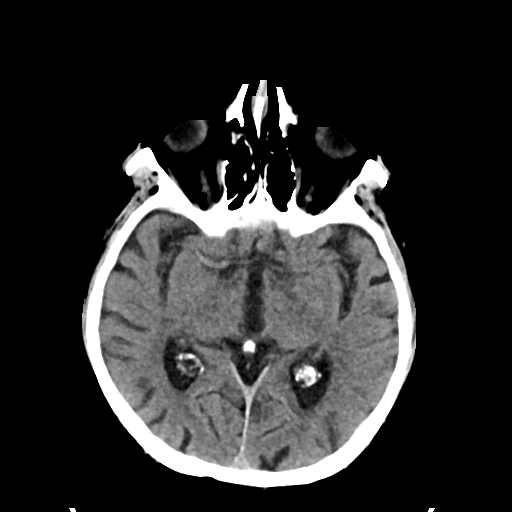
[im 20/34  brain]
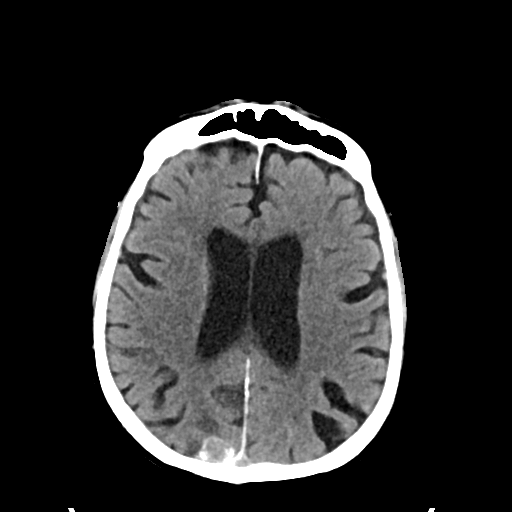
[im 27/34  brain]
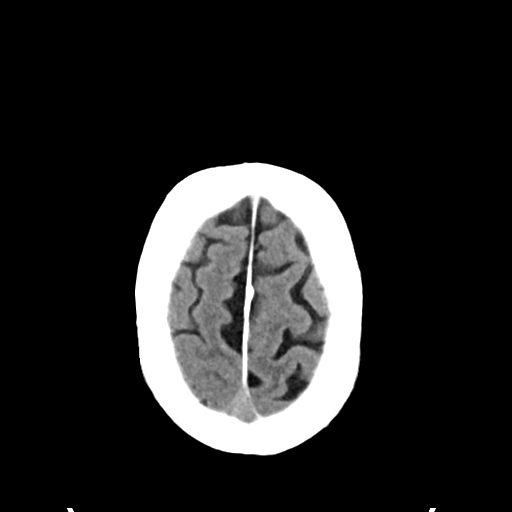

[Series 7: head bone · axial · 0.46mm/px · z∈[+1288,+1336]mm · 3 of 85 slices shown]
[im 7/85  bone]
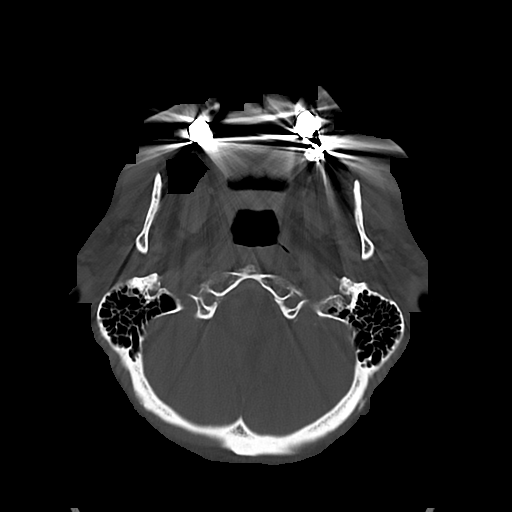
[im 19/85  bone]
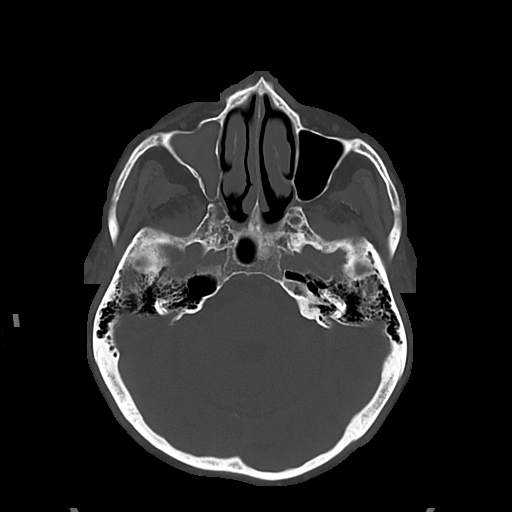
[im 31/85  bone]
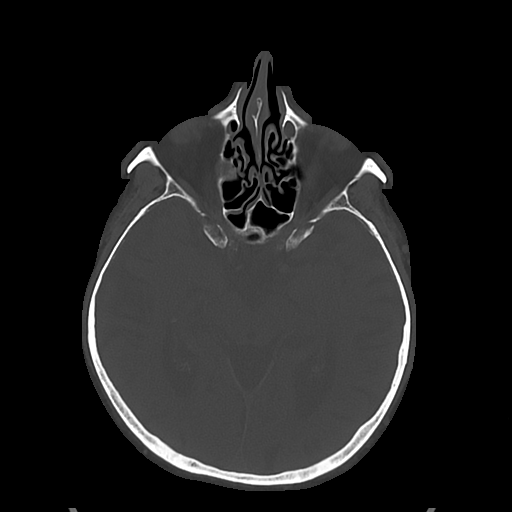

[Series 8: cor soft · coronal · 0.38mm/px · 3 of 71 slices shown]
[im 25/71  brain]
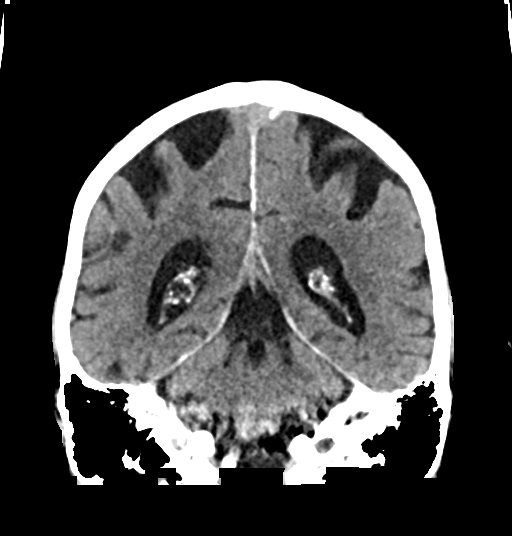
[im 32/71  brain]
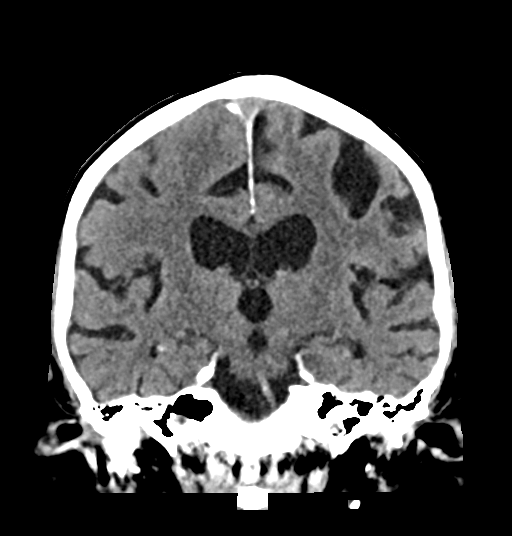
[im 39/71  brain]
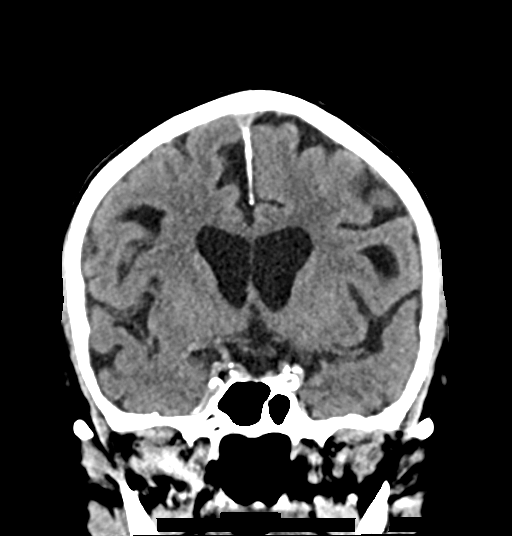

[Series 9: sag soft · sagittal · 0.39mm/px · 3 of 76 slices shown]
[im 26/76  brain]
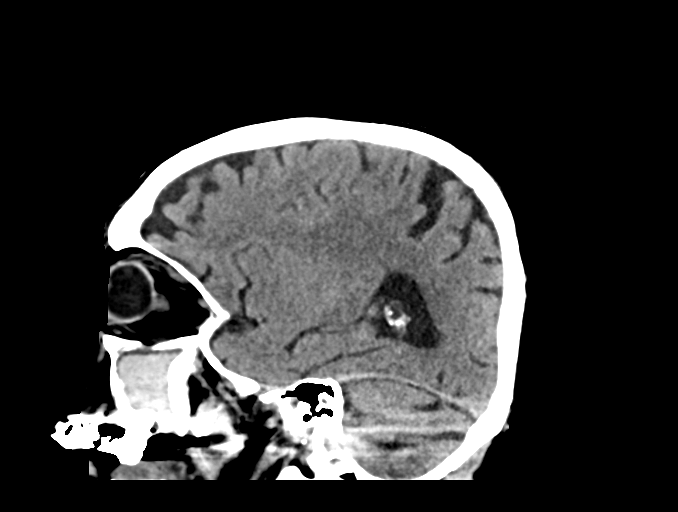
[im 38/76  brain]
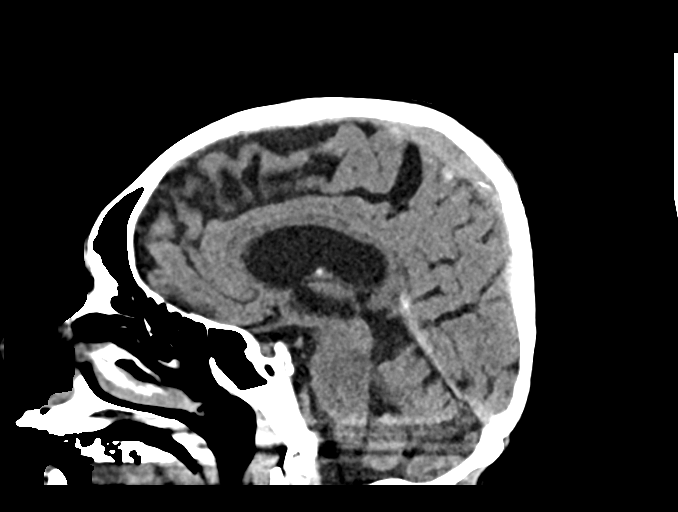
[im 51/76  brain]
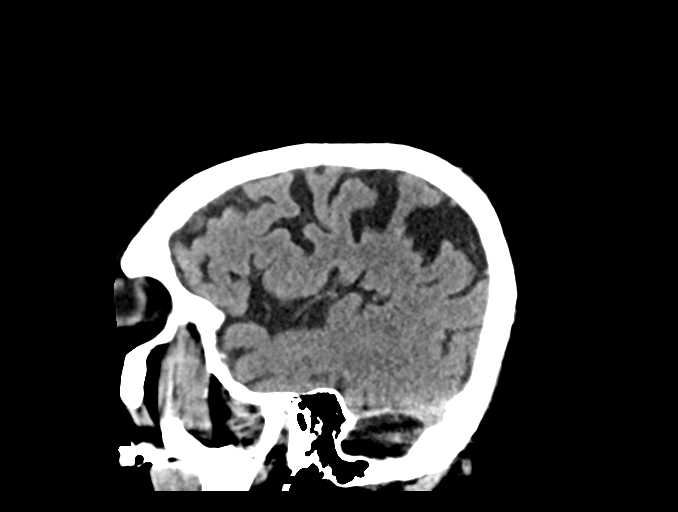

[17 of 47 positions shown; findings below may reference images not displayed]

FINDINGS: CT HEAD FINDINGS

Brain: A 1.8 x 1.2 x 2 cm partially calcified extra-axial posterior
RIGHT posterior parietal mass is noted compatible with a meningioma.
No adjacent parenchymal edema is noted.

Atrophy and mild chronic small-vessel white matter ischemic changes
are noted. There is no evidence of acute infarct, hemorrhage,
midline shift or hydrocephalus.

Vascular: Carotid and basilar atherosclerotic calcifications are
noted.

Skull: No acute or suspicious abnormalities.

Sinuses/Orbits: Opacified RIGHT maxillary sinus noted.

Other: None

CT CERVICAL SPINE FINDINGS

Alignment: Normal.

Skull base and vertebrae: No acute fracture. No primary bone lesion
or focal pathologic process.

Soft tissues and spinal canal: No prevertebral fluid or swelling. No
visible canal hematoma.

Disc levels: Moderate degenerative disc disease/spondylosis at C5-6
and C6-7 noted. Facet arthropathy within the cervical spine also
identified.

Upper chest: No acute abnormality

Other: None
IMPRESSION: 1. No evidence of acute intracranial abnormality. Atrophy and
chronic small-vessel white matter ischemic changes.
2. 1.8 x 1.2 x 2 cm posterior RIGHT parietal meningioma. No adjacent
parenchymal edema.
3. No static evidence of acute injury to the cervical spine.
